# Patient Record
Sex: Male | Born: 1960 | Race: White | Hispanic: No | Marital: Married | Smoking: Current every day smoker
Health system: Southern US, Community
[De-identification: ages and names within clinical notes are randomized; demographics above are authoritative.]

## PROBLEM LIST (undated history)

## (undated) DIAGNOSIS — C801 Malignant (primary) neoplasm, unspecified: Secondary | ICD-10-CM

## (undated) DIAGNOSIS — H539 Unspecified visual disturbance: Secondary | ICD-10-CM

## (undated) DIAGNOSIS — R519 Headache, unspecified: Secondary | ICD-10-CM

## (undated) DIAGNOSIS — R51 Headache: Secondary | ICD-10-CM

## (undated) DIAGNOSIS — I471 Supraventricular tachycardia: Secondary | ICD-10-CM

## (undated) DIAGNOSIS — I4719 Other supraventricular tachycardia: Secondary | ICD-10-CM

## (undated) DIAGNOSIS — G35 Multiple sclerosis: Secondary | ICD-10-CM

## (undated) DIAGNOSIS — C44310 Basal cell carcinoma of skin of unspecified parts of face: Secondary | ICD-10-CM

## (undated) HISTORY — DX: Supraventricular tachycardia: I47.1

## (undated) HISTORY — DX: Multiple sclerosis: G35

## (undated) HISTORY — DX: Unspecified visual disturbance: H53.9

## (undated) HISTORY — DX: Other supraventricular tachycardia: I47.19

## (undated) HISTORY — DX: Malignant (primary) neoplasm, unspecified: C80.1

## (undated) HISTORY — PX: CARDIAC ELECTROPHYSIOLOGY MAPPING AND ABLATION: SHX1292

## (undated) HISTORY — PX: TONSILLECTOMY: SUR1361

## (undated) HISTORY — DX: Basal cell carcinoma of skin of unspecified parts of face: C44.310

## (undated) HISTORY — DX: Headache: R51

## (undated) HISTORY — DX: Headache, unspecified: R51.9

---

## 2001-10-27 ENCOUNTER — Encounter: Admission: RE | Admit: 2001-10-27 | Discharge: 2001-10-27 | Payer: Self-pay | Admitting: Family Medicine

## 2001-10-27 ENCOUNTER — Encounter: Payer: Self-pay | Admitting: Family Medicine

## 2001-10-31 ENCOUNTER — Encounter: Payer: Self-pay | Admitting: Family Medicine

## 2001-10-31 ENCOUNTER — Ambulatory Visit (HOSPITAL_COMMUNITY): Admission: RE | Admit: 2001-10-31 | Discharge: 2001-10-31 | Payer: Self-pay | Admitting: Family Medicine

## 2002-12-25 ENCOUNTER — Ambulatory Visit (HOSPITAL_COMMUNITY): Admission: RE | Admit: 2002-12-25 | Discharge: 2002-12-25 | Payer: Self-pay | Admitting: Internal Medicine

## 2014-07-05 HISTORY — PX: SKIN CANCER EXCISION: SHX779

## 2015-08-26 ENCOUNTER — Ambulatory Visit (INDEPENDENT_AMBULATORY_CARE_PROVIDER_SITE_OTHER): Payer: Medicare Other | Admitting: Neurology

## 2015-08-26 ENCOUNTER — Other Ambulatory Visit: Payer: Self-pay | Admitting: *Deleted

## 2015-08-26 ENCOUNTER — Encounter: Payer: Self-pay | Admitting: Neurology

## 2015-08-26 VITALS — BP 148/84 | HR 66 | Resp 14 | Ht 73.0 in | Wt 201.2 lb

## 2015-08-26 DIAGNOSIS — F32A Depression, unspecified: Secondary | ICD-10-CM

## 2015-08-26 DIAGNOSIS — R269 Unspecified abnormalities of gait and mobility: Secondary | ICD-10-CM | POA: Diagnosis not present

## 2015-08-26 DIAGNOSIS — G894 Chronic pain syndrome: Secondary | ICD-10-CM

## 2015-08-26 DIAGNOSIS — R1013 Epigastric pain: Secondary | ICD-10-CM

## 2015-08-26 DIAGNOSIS — G8929 Other chronic pain: Secondary | ICD-10-CM | POA: Diagnosis not present

## 2015-08-26 DIAGNOSIS — F09 Unspecified mental disorder due to known physiological condition: Secondary | ICD-10-CM

## 2015-08-26 DIAGNOSIS — G35 Multiple sclerosis: Secondary | ICD-10-CM

## 2015-08-26 DIAGNOSIS — F329 Major depressive disorder, single episode, unspecified: Secondary | ICD-10-CM | POA: Insufficient documentation

## 2015-08-26 DIAGNOSIS — Z79899 Other long term (current) drug therapy: Secondary | ICD-10-CM | POA: Diagnosis not present

## 2015-08-26 DIAGNOSIS — G35D Multiple sclerosis, unspecified: Secondary | ICD-10-CM

## 2015-08-26 MED ORDER — OXYBUTYNIN CHLORIDE 5 MG PO TABS: 5.0000 mg | ORAL_TABLET | Freq: Three times a day (TID) | ORAL | Status: DC

## 2015-08-26 MED ORDER — ETODOLAC 400 MG PO TABS: 400.0000 mg | ORAL_TABLET | Freq: Two times a day (BID) | ORAL | Status: DC

## 2015-08-26 MED ORDER — BACLOFEN 10 MG PO TABS: 10.0000 mg | ORAL_TABLET | Freq: Four times a day (QID) | ORAL | Status: DC

## 2015-08-26 MED ORDER — DOXEPIN HCL 10 MG PO CAPS: 10.0000 mg | ORAL_CAPSULE | Freq: Every day | ORAL | Status: DC

## 2015-08-26 NOTE — Progress Notes (Signed)
[facility]  PATIENT: Don Hamilton DOB: 1961/01/03  REFERRING DOCTOR OR PCP:  None SOURCE: patient, records from [facility].  _________________________________   HISTORICAL  CHIEF COMPLAINT:  Chief Complaint  Patient presents with  . Multiple Sclerosis    Former pt. of Dr. Bonnita Hollow from [facility], here for eval of MS.  Sts. he was dx. approx. 1998.  Presenting sx. was left optic neuritis. Sts. dx. confirmed with MRI.  No LP.  He saw Dr. Epimenio Foot and was started on Betaseron which he stopped after 3-4 mos. due to allergic rxn/inj. site rxn.  He then tried Avonex but stopped after 2-3 mos. due to rash.  He then tried Tysabri but stopped due to allergic rxn--rash.  He then tried Campath but stopped after 1-2 infusions due to rash.  Sts. next he trie  . Gait Disturbance    either Aubagio or Tecfidera but stopped after 3 weeks due to diarrhea.  He is not currenlty on any MS med.  He thinks cognition, fatigue, insomnia are worse.  Sts. he has been out of Oxycodone and Baclofen for a couple of weeks so right leg pain, right hip pain is worse/fim    HISTORY Don Hamilton is a 55 year old man with multiple sclerosis who I have previously seen at [facility].  MS History:   He presented in 1998 with left optic neuritis. An MRI of the brain was consistent with multiple sclerosis. He was started on Betaseron but he stopped due to allergic reactions and injection site reactions. Avonex was tried but since he stopped after a while due to rash. When Tysabri was reintroduced around 2006, he went on that for about a year or so. However, he would get a rash with each infusion. Therefore, it was discontinued and he was placed on off label Campath. He received his first year dose but had severe infusion reactions with rash that lasted over a month. Therefore, sucking year Campath was not performed. In 2015, he was placed on Aubagio. After month it was stopped due to  diarrhea he has not been on Tecfidera. Gilenya was considered but he does have a cardiac arrhythmia. Last year, Dr. Alton Revere placed him on Copaxone but he decided not to take it as it is generally weaker than other medications he has been on.        Gait/strength/sensation/Pain: Even before his diagnosis in 1998, he was having some difficulty with his gait. About 10 years ago he started to use a cane.  He continues to use a cane.   Notes transient numbness in the arms and legs.   The numbness and tingling is fairly random many does not identify a trigger.   He ran out of baclofen and felt that it did help his spasticity.      Pain:  He notes right greater than left leg weakness and spasticity. He has a lot of pain in the right leg. Especially around the hip area. This has been evaluated and an orthopedic reason has not been identified.    He also notes neck and shoulder pain.    He currently is taking ibuprofen 800 mg a few times a day.   Bladder:   He feels that bladder function is doing fairly well. There is just minimal frequency and he has nocturia once most nights.  Vision: He notes mildly reduced visual acuity out of both eyes. He has altered color vision out of the left eye.  Fatigue/sleep:   He reports  some cognitive and physical fatigue. She had mild benefit from Adderall but he did note that it curbed his appetite and he has not taken that recently. Provigil was tried in the past but was not helpful. He reports difficulty falling and staying asleep.   He feels unrefreshed in the morning.  Mood/cognition: He feels he has a mild depression. In the past she was on anti-depressant but felt he was able to stop and is uncertain if he needs anything now. He has noted difficulty with cognition. Specifically he has reduced attention, short-term memory and executive function. He was working as an Art gallery manager in the past.  He had a UDS that was positive for cocaine last year.  He denies any use of it.  We  discussed that for the time being I can't write any controlled substances for him.     REVIEW OF SYSTEMS: Constitutional: No fevers, chills, sweats, or change in appetite.   He has fatigue and insomnia Eyes: No visual changes, double vision, eye pain Ear, nose and throat: No hearing loss, ear pain, nasal congestion, sore throat Cardiovascular: No chest pain, palpitations Respiratory: No shortness of breath at rest or with exertion.   No wheezes GastrointestinaI: No nausea, vomiting, diarrhea, abdominal pain, fecal incontinence Genitourinary: No dysuria, urinary retention or frequency.  No nocturia. Musculoskeletal: No neck pain.  Notes right hip > back pain Integumentary: No rash, pruritus, skin lesions Neurological: as above Psychiatric: Notes mild depression at this time.  No anxiety Endocrine: No palpitations, diaphoresis, change in appetite, change in weigh or increased thirst Hematologic/Lymphatic: No anemia, purpura, petechiae. Allergic/Immunologic: No itchy/runny eyes, nasal congestion, recent allergic reactions, rashes  ALLERGIES: Allergies  Allergen Reactions  . Campath [Alemtuzumab] Hives  . Avonex [Interferon Beta-1a] Rash  . Betaseron [Interferon Beta-1b] Rash  . Tysabri [Natalizumab] Rash    HOME MEDICATIONS:  Current outpatient prescriptions:  .  baclofen (LIORESAL) 10 MG tablet, Take 10 mg by mouth 4 (four) times daily., Disp: , Rfl:  .  hydrOXYzine (ATARAX/VISTARIL) 10 MG tablet, Take 10 mg by mouth 3 (three) times daily as needed., Disp: , Rfl:  .  ibuprofen (ADVIL,MOTRIN) 200 MG tablet, Take 200 mg by mouth every 6 (six) hours as needed., Disp: , Rfl:  .  meclizine (ANTIVERT) 25 MG tablet, Take 25 mg by mouth 2 (two) times daily as needed for dizziness., Disp: , Rfl:  .  omeprazole (PRILOSEC) 20 MG capsule, Take 20 mg by mouth daily., Disp: , Rfl:  .  Oxycodone HCl 10 MG TABS, Take 10 mg by mouth 4 (four) times daily as needed., Disp: , Rfl:   PAST MEDICAL  HISTORY: Past Medical History  Diagnosis Date  . Multiple sclerosis (HCC)   . Cancer (HCC)   . Basal cell carcinoma of face   . Headache   . Vision abnormalities   . Atrial tachycardia (HCC)     PAST SURGICAL HISTORY: Past Surgical History  Procedure Laterality Date  . Skin cancer excision  2016    nose  . Tonsillectomy    . Cardiac electrophysiology mapping and ablation      FAMILY HISTORY: Family History  Problem Relation Age of Onset  . Healthy Mother   . Stroke Father   . Heart attack Father   . Leukemia Father     SOCIAL HISTORY:  Social History   Social History  . Marital Status: Married    Spouse Name: N/A  . Number of Children: N/A  . Years of Education:  N/A   Occupational History  . Not on file.   Social History Main Topics  . Smoking status: Not on file  . Smokeless tobacco: Not on file  . Alcohol Use: Not on file  . Drug Use: Not on file  . Sexual Activity: Not on file   Other Topics Concern  . Not on file   Social History Narrative  . No narrative on file     PHYSICAL EXAM  Filed Vitals:   08/26/15 1021  BP: 148/84  Pulse: 66  Resp: 14  Height: 6\' 1"  (1.854 m)  Weight: 201 lb 3.2 oz (91.264 kg)    Body mass index is 26.55 kg/(m^2).   General: The patient is well-developed and well-nourished and in no acute distress  Eyes:  Funduscopic exam shows normal optic discs and retinal vessels.  Neck: The neck is supple, no carotid bruits are noted.  The neck is nontender.  Cardiovascular: The heart has a regular rate and rhythm with a normal S1 and S2. There were no murmurs, gallops or rubs. Lungs are clear to auscultation.  Skin: Extremities are without significant edema.  Musculoskeletal:  Back is nontender  Neurologic Exam  Mental status: The patient is alert and oriented x 3 at the time of the examination. The patient has apparent normal recent and remote memory, with an apparently normal attention span and concentration  ability.   Speech is normal.  Cranial nerves: Extraocular movements are full. Pupils are equal, round, and reactive to light and accomodation.  Visual fields are full.  Color vision is slightly reduced on left.   VA is symmetric.  Facial symmetry is present. There is good facial sensation to soft touch bilaterally.Facial strength is normal.  Trapezius and sternocleidomastoid strength is normal. No dysarthria is noted.  The tongue is midline, and the patient has symmetric elevation of the soft palate. No obvious hearing deficits are noted.  Motor:  Muscle bulk is normal.   Tone is normal. Strength is  5 / 5 in all 4 extremities.   Sensory: Sensory testing is intact to  touch and vibration sensation in all 4 extremities.  Coordination: Cerebellar testing reveals good finger-nose-finger and reduced heel-to-shin bilaterally.  Gait and station: Station is normal.   Gait is mildly wide. Tandem gait is wide. Romberg is negative.   Reflexes: Deep tendon reflexes are symmetric and increased in legs bilaterally.   Plantar responses are flexor.    DIAGNOSTIC DATA (LABS, IMAGING, TESTING) - I reviewed patient records, labs, notes, testing and imaging myself where available.      ASSESSMENT AND PLAN  Multiple sclerosis (HCC) - Plan: CBC with Differential/Platelet, Hepatic function panel, Hepatitis c antibody (reflex), Hepatitis B surface ag-pre vc imm st, Hepatitis B surface antibody, Quantiferon tb gold assay (blood), Hep B Surface Antigen, Compliance Drug Analysis, Ur  High risk medication use - Plan: CBC with Differential/Platelet, Hepatic function panel, Hepatitis c antibody (reflex), Hepatitis B surface ag-pre vc imm st, Hepatitis B surface antibody, Quantiferon tb gold assay (blood), Hep B Surface Antigen, Compliance Drug Analysis, Ur, CANCELED: [id number] 11+Oxyco+Alc+Crt-Bund  Gait disturbance  Abdominal pain, chronic, epigastric  Cognitive disorder  Depression  Chronic pain syndrome -  Plan: CANCELED: [id number] 11+Oxyco+Alc+Crt-Bund   1.   We had a long discussion about disease modifying therapies. He has had difficulty tolerating quite a few different medications. We went over some of the options including Zinbryta and waiting for ocrelizumab. We discussed the pros and cons of these options. He  would like to go ahead and start Zinbryta and I had him sign the service request form. We will check blood work for CBC, LFT, hep C, hep B and TB. He is advised to call us immediately if he notes any significant skin changes 2.   I will get him back on the baclofen and start doxepin at night to help with his sleep. Additionally I will switch his OTC ibuprofen to Lodine 400 mg by mouth twice a day to simplify the medications throughout the day. 3.   Oxybutynin when necessary for the bladder. 4.   At this point, I would not be writing any controlled substances for him.  5.   He will return to see me in 4 months or sooner if there are new or worsening neurologic symptoms.  45 minute face-to-face evaluation with greater than one half of the time counseling coordinating care about multiple sclerosis, symptoms and treatments.   Malaak Stach A. Epimenio Foot, MD, PhD 08/26/2015, 10:23 AM Certified in Neurology, Clinical Neurophysiology, Sleep Medicine, Pain Medicine and Neuroimaging  [facility] [address], [address] [place], [place] [address] [phone]

## 2015-08-27 ENCOUNTER — Telehealth: Payer: Self-pay | Admitting: *Deleted

## 2015-08-27 LAB — CBC WITH DIFFERENTIAL/PLATELET
Basophils Absolute: 0 10*3/uL (ref 0.0–0.2)
Basos: 0 %
EOS (ABSOLUTE): 0.1 10*3/uL (ref 0.0–0.4)
Eos: 1 %
Hematocrit: 42.9 % (ref 37.5–51.0)
Hemoglobin: 14.6 g/dL (ref 12.6–17.7)
Immature Grans (Abs): 0 10*3/uL (ref 0.0–0.1)
Immature Granulocytes: 0 %
Lymphocytes Absolute: 0.9 10*3/uL (ref 0.7–3.1)
Lymphs: 10 %
MCH: 33.1 pg — ABNORMAL HIGH (ref 26.6–33.0)
MCHC: 34 g/dL (ref 31.5–35.7)
MCV: 97 fL (ref 79–97)
Monocytes Absolute: 0.4 10*3/uL (ref 0.1–0.9)
Monocytes: 4 %
Neutrophils Absolute: 8 10*3/uL — ABNORMAL HIGH (ref 1.4–7.0)
Neutrophils: 85 %
Platelets: 174 10*3/uL (ref 150–379)
RBC: 4.41 x10E6/uL (ref 4.14–5.80)
RDW: 13.3 % (ref 12.3–15.4)
WBC: 9.5 10*3/uL (ref 3.4–10.8)

## 2015-08-27 LAB — HEPATIC FUNCTION PANEL
ALT: 12 IU/L (ref 0–44)
AST: 18 IU/L (ref 0–40)
Albumin: 4.1 g/dL (ref 3.5–5.5)
Alkaline Phosphatase: 70 IU/L (ref 39–117)
Bilirubin Total: 0.8 mg/dL (ref 0.0–1.2)
Bilirubin, Direct: 0.22 mg/dL (ref 0.00–0.40)
Total Protein: 6.3 g/dL (ref 6.0–8.5)

## 2015-08-27 LAB — HCV COMMENT:

## 2015-08-27 LAB — HEPATITIS B SURFACE ANTIBODY, QUANTITATIVE: Hepatitis B Surf Ab Quant: <3.1 m[IU]/mL — ABNORMAL LOW (ref >9.9)

## 2015-08-27 LAB — HEPATITIS C ANTIBODY (REFLEX): HCV Ab: <0.1 s/co ratio (ref 0.0–0.9)

## 2015-08-27 NOTE — Telephone Encounter (Signed)
Disability form for [facility] completed and faxed back to [facility] fax # [phone].  Original mailed to pt. at his home address.  Copy sent to be scanned into EPIC/fim

## 2015-08-29 LAB — QUANTIFERON IN TUBE
QFT TB AG MINUS NIL VALUE: 0.01 [IU]/mL
QUANTIFERON MITOGEN VALUE: 4.78 [IU]/mL
QUANTIFERON TB AG VALUE: 0.04 [IU]/mL
QUANTIFERON TB GOLD: NEGATIVE
Quantiferon Nil Value: 0.03 IU/mL

## 2015-08-29 LAB — QUANTIFERON TB GOLD ASSAY (BLOOD)

## 2015-08-30 LAB — HEPATITIS B SURFACE ANTIGEN: HEP B S AG: NEGATIVE

## 2015-08-30 LAB — COMPLIANCE DRUG ANALYSIS, UR: PDF: 0

## 2015-09-10 ENCOUNTER — Encounter: Payer: Self-pay | Admitting: Neurology

## 2016-08-04 ENCOUNTER — Encounter: Payer: Self-pay | Admitting: Neurology

## 2016-08-04 ENCOUNTER — Ambulatory Visit (INDEPENDENT_AMBULATORY_CARE_PROVIDER_SITE_OTHER): Payer: Medicare Other | Admitting: Neurology

## 2016-08-04 VITALS — BP 151/101 | HR 67 | Resp 16 | Ht 73.0 in | Wt 205.0 lb

## 2016-08-04 DIAGNOSIS — Z79899 Other long term (current) drug therapy: Secondary | ICD-10-CM | POA: Diagnosis not present

## 2016-08-04 DIAGNOSIS — G4700 Insomnia, unspecified: Secondary | ICD-10-CM | POA: Insufficient documentation

## 2016-08-04 DIAGNOSIS — R269 Unspecified abnormalities of gait and mobility: Secondary | ICD-10-CM

## 2016-08-04 DIAGNOSIS — F09 Unspecified mental disorder due to known physiological condition: Secondary | ICD-10-CM

## 2016-08-04 DIAGNOSIS — R5383 Other fatigue: Secondary | ICD-10-CM | POA: Insufficient documentation

## 2016-08-04 DIAGNOSIS — F329 Major depressive disorder, single episode, unspecified: Secondary | ICD-10-CM

## 2016-08-04 DIAGNOSIS — F32A Depression, unspecified: Secondary | ICD-10-CM

## 2016-08-04 DIAGNOSIS — G35 Multiple sclerosis: Secondary | ICD-10-CM

## 2016-08-04 MED ORDER — GABAPENTIN 300 MG PO CAPS: 300.0000 mg | ORAL_CAPSULE | Freq: Three times a day (TID) | ORAL | 11 refills | Status: DC

## 2016-08-04 MED ORDER — HYDROXYZINE HCL 10 MG PO TABS: 10.0000 mg | ORAL_TABLET | Freq: Three times a day (TID) | ORAL | 5 refills | Status: DC | PRN

## 2016-08-04 MED ORDER — CYCLOBENZAPRINE HCL 5 MG PO TABS: ORAL_TABLET | ORAL | 11 refills | Status: DC

## 2016-08-04 MED ORDER — BACLOFEN 10 MG PO TABS: 10.0000 mg | ORAL_TABLET | Freq: Four times a day (QID) | ORAL | 11 refills | Status: DC

## 2016-08-04 MED ORDER — OXYBUTYNIN CHLORIDE 5 MG PO TABS: 5.0000 mg | ORAL_TABLET | Freq: Three times a day (TID) | ORAL | 11 refills | Status: DC

## 2016-08-04 NOTE — Progress Notes (Addendum)
[facility]  PATIENT: Don Hamilton DOB: 06/06/1961  REFERRING DOCTOR OR PCP:  None SOURCE: patient, records from [facility].  _________________________________   HISTORICAL  CHIEF COMPLAINT:  Chief Complaint  Patient presents with  . Multiple Sclerosis    He continues off of any MS med.  Sts. he is having more difficulty with memory/cognition.  Also c/o more difficulty with going to and staying asleep. He has not been taking Doxepin--sts. he wasn't aware this was a sleep med, so he stopped it./fim    HISTORY Don Hamilton is a 56 year old man with multiple sclerosis.  MS:   He remains off any DMT.   He was on Alemtuzumab about 2011 but had side effects and never had the second year.   Gait/strength/sensation/Pain: He has had difficulty with his gait since 2000. About 10 years ago he started to use a cane.  He continues to use a cane.   Notes transient numbness in the arms and legs.   The numbness and tingling is fairly random many does not identify a trigger.   He ran out of baclofen and felt that it did help his spasticity.      Pain:  He notes pain in the right greater than left leg associated with spasticity. Hip area is most painful.   This has been evaluated and an orthopedic reason has not been identified.    He also notes neck and shoulder pain.    He currently is taking ibuprofen 800 mg a few times a day.   He takes Aleve.   He had a UDS that was positive for cocaine in 2016.  He denies any use of it.  We discussed that for the time being I can't write any controlled substances for him.     Bladder:   His bladder function is doing weld he tolerates it welll on oxybutynin . There is just minimal frequency and he has nocturia x 1.  Vision: He notes mildly reduced visual acuity out of both eyes. He has altered color vision out of the left eye.  Fatigue/sleep:   He reports some cognitive and physical fatigue. He had mild benefit for the fatigue with the  Adderall but it was not well tolerated. Nuvigil was tried in the past but was not helpful. He reports difficulty falling and staying asleep.  He did not take the doxepin.   He feels unrefreshed in the morning.   A PSG did not show OSA.   Mood/cognition: He notes mild depression and some anxiety.  Anti depressants had not helped much so he stopped.   He was working as an Art gallery manager in the past.   Cognitive issues are his main concernHe has difficulty with cognition, mostly problems with reduced attention, short-term memory and executive function.    MS History:   He presented in 1998 with left optic neuritis. An MRI of the brain was consistent with multiple sclerosis. He was started on Betaseron but he stopped due to allergic reactions and injection site reactions. Avonex was tried but since he stopped after a while due to rash. When Tysabri was reintroduced around 2006, he went on that for about a year or so. However, he would get a rash with each infusion. Therefore, it was discontinued and he was placed on off label Campath. He received his first year dose but had severe infusion reactions with rash that lasted over a month. Therefore, sucking year Campath was not performed. In 2015, he was placed on  Aubagio. After month it was stopped due to diarrhea he has not been on Tecfidera. Gilenya was considered but he does have a cardiac arrhythmia. Last year, Dr. Alton Revere placed him on Copaxone but he decided not to take it as it is generally weaker than other medications he has been on.         REVIEW OF SYSTEMS: Constitutional: No fevers, chills, sweats, or change in appetite.   He has fatigue and insomnia Eyes: No visual changes, double vision, eye pain Ear, nose and throat: No hearing loss, ear pain, nasal congestion, sore throat Cardiovascular: No chest pain, palpitations Respiratory: No shortness of breath at rest or with exertion.   No wheezes GastrointestinaI: No nausea, vomiting, diarrhea, abdominal  pain, fecal incontinence Genitourinary: No dysuria, urinary retention or frequency.  No nocturia. Musculoskeletal: No neck pain.  Notes right hip > back pain Integumentary: No rash, pruritus, skin lesions Neurological: as above Psychiatric: Notes mild depression at this time.  No anxiety Endocrine: No palpitations, diaphoresis, change in appetite, change in weigh or increased thirst Hematologic/Lymphatic: No anemia, purpura, petechiae. Allergic/Immunologic: No itchy/runny eyes, nasal congestion, recent allergic reactions, rashes  ALLERGIES: Allergies  Allergen Reactions  . Campath [Alemtuzumab] Hives  . Avonex [Interferon Beta-1a] Rash  . Betaseron [Interferon Beta-1b] Rash  . Tysabri [Natalizumab] Rash    HOME MEDICATIONS:  Current Outpatient Prescriptions:  .  baclofen (LIORESAL) 10 MG tablet, Take 1 tablet (10 mg total) by mouth 4 (four) times daily., Disp: 120 each, Rfl: 11 .  etodolac (LODINE) 400 MG tablet, Take 1 tablet (400 mg total) by mouth 2 (two) times daily., Disp: 60 tablet, Rfl: 5 .  hydrOXYzine (ATARAX/VISTARIL) 10 MG tablet, Take 1 tablet (10 mg total) by mouth 3 (three) times daily as needed., Disp: 90 tablet, Rfl: 5 .  ibuprofen (ADVIL,MOTRIN) 200 MG tablet, Take 200 mg by mouth every 6 (six) hours as needed., Disp: , Rfl:  .  meclizine (ANTIVERT) 25 MG tablet, Take 25 mg by mouth 2 (two) times daily as needed for dizziness., Disp: , Rfl:  .  oxybutynin (DITROPAN) 5 MG tablet, Take 1 tablet (5 mg total) by mouth 3 (three) times daily., Disp: 60 tablet, Rfl: 11 .  cyclobenzaprine (FLEXERIL) 5 MG tablet, One to two at bedtime, Disp: 60 tablet, Rfl: 11 .  doxepin (SINEQUAN) 10 MG capsule, Take 1 capsule (10 mg total) by mouth at bedtime. (Patient not taking: Reported on 08/04/2016), Disp: 30 capsule, Rfl: 5 .  gabapentin (NEURONTIN) 300 MG capsule, Take 1 capsule (300 mg total) by mouth 3 (three) times daily., Disp: 90 capsule, Rfl: 11 .  omeprazole (PRILOSEC) 20 MG  capsule, Take 20 mg by mouth daily., Disp: , Rfl:  .  Oxycodone HCl 10 MG TABS, Take 10 mg by mouth 4 (four) times daily as needed., Disp: , Rfl:   PAST MEDICAL HISTORY: Past Medical History:  Diagnosis Date  . Atrial tachycardia (HCC)   . Basal cell carcinoma of face   . Cancer (HCC)   . Headache   . Multiple sclerosis (HCC)   . Vision abnormalities     PAST SURGICAL HISTORY: Past Surgical History:  Procedure Laterality Date  . CARDIAC ELECTROPHYSIOLOGY MAPPING AND ABLATION    . SKIN CANCER EXCISION  2016   nose  . TONSILLECTOMY      FAMILY HISTORY: Family History  Problem Relation Age of Onset  . Healthy Mother   . Stroke Father   . Heart attack Father   .  Leukemia Father     SOCIAL HISTORY:  Social History   Social History  . Marital status: Married    Spouse name: N/A  . Number of children: N/A  . Years of education: N/A   Occupational History  . Not on file.   Social History Main Topics  . Smoking status: Current Every Day Smoker  . Smokeless tobacco: Never Used  . Alcohol use 0.0 oz/week     Comment: occasional beer  . Drug use: No  . Sexual activity: Not on file   Other Topics Concern  . Not on file   Social History Narrative  . No narrative on file     PHYSICAL EXAM  Vitals:   08/04/16 0959  BP: (!) 151/101  Pulse: 67  Resp: 16  Weight: 205 lb (93 kg)  Height: 6\' 1"  (1.854 m)    Body mass index is 27.05 kg/m.   General: The patient is well-developed and well-nourished and in no acute distress   Neurologic Exam  Mental status: The patient is alert and oriented x 3 at the time of the examination. The patient has apparent normal recent and remote memory, with an apparently normal attention span and concentration ability.   Speech is normal.  Cranial nerves: Extraocular movements are full. Pupils are equal, round, and reactive to light and accomodation.  Visual fields are full.  Color vision is slightly reduced on left.      Facial  symmetry is present. There is good facial sensation to soft touch bilaterally.Facial strength is normal.  Trapezius and sternocleidomastoid strength is normal. No dysarthria is noted.  The tongue is midline, and the patient has symmetric elevation of the soft palate. No obvious hearing deficits are noted.  Motor:  Muscle bulk is normal.   Tone is normal. Strength is  5 / 5 in all 4 extremities.   Sensory: Sensory testing is intact to  touch and vibration sensation in all 4 extremities.  Coordination: Cerebellar testing reveals good finger-nose-finger but he has reducedheel-to-shin bilaterally.  Gait and station: Station is normal.   His gait is mildly wide and the tandem gait is wide. Romberg is negative.   Reflexes: Deep tendon reflexes are symmetric and increased in legs bilaterally. There was spread at the knees.  Plantar responses are flexor.    DIAGNOSTIC DATA (LABS, IMAGING, TESTING) - I reviewed patient records, labs, notes, testing and imaging myself where available.      ASSESSMENT AND PLAN  Multiple sclerosis (HCC) - Plan: CBC with Differential/Platelet, Hepatic function panel  Gait disturbance  High risk medication use  Depression, unspecified depression type  Insomnia, unspecified type  Other fatigue   1.   He never went on Zinbryta and would prefer not to due to the liver toxicity issues. We did discuss other options and he is most interested in [place]. He signed the service request form. 2.   Baclofen was renewed. He will continue with the Flexeril at bedtime and gabapentin and etodolac to help with the pain. I renewed hydroxyzine for his itching to take as needed.  3.   He will continue oxybutynin for his bladder issues.  4.   Due to the combination of his MS related physical and cognitive impairments, Mr. Iwanicki is unable to work.  5.   Return to see me in 6 months or sooner if there are new or worsening neurologic symptoms.   Tanayia Wahlquist A. Epimenio Foot, MD, PhD  08/04/2016, 10:39 AM Certified in Neurology, Clinical Neurophysiology,  Sleep Medicine, Pain Medicine and Neuroimaging  [facility] [address] [place], [place] [address] [phone]

## 2016-08-05 LAB — HEPATIC FUNCTION PANEL
ALT: 21 IU/L (ref 0–44)
AST: 21 IU/L (ref 0–40)
Albumin: 4.7 g/dL (ref 3.5–5.5)
Alkaline Phosphatase: 59 IU/L (ref 39–117)
BILIRUBIN TOTAL: 0.9 mg/dL (ref 0.0–1.2)
BILIRUBIN, DIRECT: 0.2 mg/dL (ref 0.00–0.40)
TOTAL PROTEIN: 6.9 g/dL (ref 6.0–8.5)

## 2016-08-05 LAB — CBC WITH DIFFERENTIAL/PLATELET
BASOS ABS: 0 10*3/uL (ref 0.0–0.2)
BASOS: 0 %
EOS (ABSOLUTE): 0.1 10*3/uL (ref 0.0–0.4)
Eos: 1 %
Hematocrit: 40.9 % (ref 37.5–51.0)
Hemoglobin: 14 g/dL (ref 13.0–17.7)
IMMATURE GRANS (ABS): 0 10*3/uL (ref 0.0–0.1)
Immature Granulocytes: 0 %
LYMPHS: 14 %
Lymphocytes Absolute: 1 10*3/uL (ref 0.7–3.1)
MCH: 32.4 pg (ref 26.6–33.0)
MCHC: 34.2 g/dL (ref 31.5–35.7)
MCV: 95 fL (ref 79–97)
Monocytes Absolute: 0.4 10*3/uL (ref 0.1–0.9)
Monocytes: 6 %
Neutrophils Absolute: 5.4 10*3/uL (ref 1.4–7.0)
Neutrophils: 79 %
PLATELETS: 159 10*3/uL (ref 150–379)
RBC: 4.32 x10E6/uL (ref 4.14–5.80)
RDW: 13.3 % (ref 12.3–15.4)
WBC: 6.8 10*3/uL (ref 3.4–10.8)

## 2016-08-10 ENCOUNTER — Encounter: Payer: Self-pay | Admitting: *Deleted

## 2016-08-11 ENCOUNTER — Telehealth: Payer: Self-pay | Admitting: Neurology

## 2016-08-11 NOTE — Telephone Encounter (Signed)
I have spoken with Don Hamilton--rx/srf will be sent to [facility] by [employer]/fim

## 2016-08-11 NOTE — Telephone Encounter (Signed)
Leann with [facility] is calling to get a new Rx for Tecfidera for the patient.

## 2016-08-16 ENCOUNTER — Encounter: Payer: Self-pay | Admitting: *Deleted

## 2016-08-19 ENCOUNTER — Ambulatory Visit
Admission: RE | Admit: 2016-08-19 | Discharge: 2016-08-19 | Disposition: A | Discharge status: Home / Self Care | Payer: Medicare Other | Source: Ambulatory Visit | Attending: Neurology | Admitting: Neurology

## 2016-08-19 DIAGNOSIS — R269 Unspecified abnormalities of gait and mobility: Secondary | ICD-10-CM | POA: Diagnosis not present

## 2016-08-19 DIAGNOSIS — G35 Multiple sclerosis: Secondary | ICD-10-CM

## 2016-08-19 MED ORDER — GADOBENATE DIMEGLUMINE 529 MG/ML IV SOLN
20.0000 mL | Freq: Once | INTRAVENOUS | Status: AC | PRN
  Administered 2016-08-19: 20 mL via INTRAVENOUS

## 2016-08-20 ENCOUNTER — Telehealth: Payer: Self-pay | Admitting: *Deleted

## 2016-08-20 NOTE — Telephone Encounter (Signed)
-----   Message from Britt Bottom, MD sent at 08/19/2016  6:57 PM EST ----- Please let him know that the MRI of the brain does not show any new lesions.  (it was compared to [place])

## 2016-08-20 NOTE — Telephone Encounter (Signed)
I have spoken with wife Murray Hodgkins, and per RAS, advised MRI shows no new MS lesions.  She verbalized understanding of same/fim

## 2017-08-19 ENCOUNTER — Other Ambulatory Visit: Payer: Self-pay | Admitting: Neurology

## 2017-09-01 ENCOUNTER — Other Ambulatory Visit: Payer: Self-pay

## 2017-09-01 ENCOUNTER — Ambulatory Visit: Payer: Medicare Other | Admitting: Neurology

## 2017-09-01 ENCOUNTER — Encounter (INDEPENDENT_AMBULATORY_CARE_PROVIDER_SITE_OTHER): Payer: Self-pay

## 2017-09-01 ENCOUNTER — Encounter: Payer: Self-pay | Admitting: Neurology

## 2017-09-01 VITALS — BP 134/83 | HR 73 | Ht 73.0 in | Wt 208.0 lb

## 2017-09-01 DIAGNOSIS — R269 Unspecified abnormalities of gait and mobility: Secondary | ICD-10-CM

## 2017-09-01 DIAGNOSIS — F418 Other specified anxiety disorders: Secondary | ICD-10-CM

## 2017-09-01 DIAGNOSIS — Z79899 Other long term (current) drug therapy: Secondary | ICD-10-CM

## 2017-09-01 DIAGNOSIS — F09 Unspecified mental disorder due to known physiological condition: Secondary | ICD-10-CM | POA: Diagnosis not present

## 2017-09-01 DIAGNOSIS — G35 Multiple sclerosis: Secondary | ICD-10-CM | POA: Diagnosis not present

## 2017-09-01 MED ORDER — BACLOFEN 10 MG PO TABS: 10.0000 mg | ORAL_TABLET | Freq: Four times a day (QID) | ORAL | 11 refills | Status: DC

## 2017-09-01 MED ORDER — GABAPENTIN 300 MG PO CAPS: 300.0000 mg | ORAL_CAPSULE | Freq: Three times a day (TID) | ORAL | 11 refills | Status: DC

## 2017-09-01 MED ORDER — CYCLOBENZAPRINE HCL 5 MG PO TABS: ORAL_TABLET | ORAL | 11 refills | Status: DC

## 2017-09-01 MED ORDER — ESCITALOPRAM OXALATE 20 MG PO TABS: 20.0000 mg | ORAL_TABLET | Freq: Every day | ORAL | 11 refills | Status: DC

## 2017-09-01 MED ORDER — HYDROXYZINE HCL 10 MG PO TABS: 10.0000 mg | ORAL_TABLET | Freq: Three times a day (TID) | ORAL | 5 refills | Status: DC | PRN

## 2017-09-01 MED ORDER — OXYBUTYNIN CHLORIDE 5 MG PO TABS: 5.0000 mg | ORAL_TABLET | Freq: Two times a day (BID) | ORAL | 11 refills | Status: DC

## 2017-09-01 MED ORDER — MONTELUKAST SODIUM 10 MG PO TABS: 10.0000 mg | ORAL_TABLET | Freq: Every day | ORAL | 11 refills | Status: DC

## 2017-09-01 MED ORDER — DOXEPIN HCL 10 MG PO CAPS: 10.0000 mg | ORAL_CAPSULE | Freq: Every day | ORAL | 11 refills | Status: DC

## 2017-09-01 NOTE — Progress Notes (Signed)
[facility]  PATIENT: Don Hamilton DOB: 1961-01-24  REFERRING DOCTOR OR PCP:  None SOURCE: patient, records from [facility].  _________________________________   HISTORICAL  CHIEF COMPLAINT:  Chief Complaint  Patient presents with  . Multiple Sclerosis    Sts. he started [place] but stopped 2-3 mos. ago b/c he was having whelps/hives.  Spoke with the pharmaicst and was advised to stop.  Sts. he tried to call our office to discuss this, but there is no record of a call from him.  Sts. he still has difficulty going to and staying asleep.  Has Doxepin at home but has not tried it.  C/O increased fatigue, anxiety, generalized pain.  Needs r/f of Flexeril, Gabapentin, Hydroxyzine, Oxybutynin, Gabapentin.    HISTORY Don Hamilton is a 57 year old man with multiple sclerosis.  Update 09/01/2017:   He started the Tecfidera but stopped  a couple months ago because he was having side effects including flushing and rash/hives.  The rash would last the whole day.   Of note, he did have one course of treatment with alemtuzumab in 2011 (off label at that time) but did not have the second years infusion due to infusion reactions.     Before alemtuzumab, he was on Tysabri but had severe infusion reactions and needed to stop.   He also had been at various times on Betaseron, Aubagio, Gilenya.     He is having difficulty with his gait and relies on a cane constantly now. Going up and down stairs is difficult. He stumbles and has occasional falls. He has spasticity mostly in the legs. Baclofen gives him some benefit. He continues to have some difficulty with bladder frequency and urgency. This is helped a little bit with the oxybutynin.   Vision is doing about the same (mild reduced on left). The patient is doing about the same as last visit.  He feels his sleep and fatigue are both doing worse. Insomnia has been a chronic issue. He does sleep a little bit better when he takes  gabapentin, Flexeril and baclofen at bedtime.    He tried Nuvigil for fatigue but there was no benefit.     Depression and anxiety are both worsening. Years ago he was on Lexapro with some benefit.  He does have basal cell carcinoma (nose) and had surgery.   From 08/04/2016: MS:   He remains off any DMT.   He was on Alemtuzumab about 2011 but had side effects and never had the second year.    Gait/strength/sensation/Pain: He has had difficulty with his gait since 2000. About 10 years ago he started to use a cane.  He continues to use a cane.   Notes transient numbness in the arms and legs.   The numbness and tingling is fairly random many does not identify a trigger.   He ran out of baclofen and felt that it did help his spasticity.      Pain:  He notes pain in the right greater than left leg associated with spasticity. Hip area is most painful.   This has been evaluated and an orthopedic reason has not been identified.    He also notes neck and shoulder pain.    He currently is taking ibuprofen 800 mg a few times a day.   He takes Aleve.   He had a UDS that was positive for cocaine in 2016.  He denies any use of it.  We discussed that for the time being I can't write  any controlled substances for him.     Bladder:   His bladder function is doing weld he tolerates it welll on oxybutynin . There is just minimal frequency and he has nocturia x 1.  Vision: He notes mildly reduced visual acuity out of both eyes. He has altered color vision out of the left eye.  Fatigue/sleep:   He reports some cognitive and physical fatigue. He had mild benefit for the fatigue with the Adderall but it was not well tolerated. Nuvigil was tried in the past but was not helpful. He reports difficulty falling and staying asleep.  He did not take the doxepin.   He feels unrefreshed in the morning.   A PSG did not show OSA.   Mood/cognition: He notes mild depression and some anxiety.  Anti depressants had not helped much so  he stopped.   He was working as an Art gallery manager in the past.   Cognitive issues are his main concernHe has difficulty with cognition, mostly problems with reduced attention, short-term memory and executive function.    MS History:   He presented in 1998 with left optic neuritis. An MRI of the brain was consistent with multiple sclerosis. He was started on Betaseron but he stopped due to allergic reactions and injection site reactions. Avonex was tried but since he stopped after a while due to rash. When Tysabri was reintroduced around 2006, he went on that for about a year or so. However, he would get a rash with each infusion. Therefore, it was discontinued and he was placed on off label Campath. He received his first year dose but had severe infusion reactions with rash that lasted over a month. Therefore, sucking year Campath was not performed. In 2015, he was placed on Aubagio. After month it was stopped due to diarrhea he has not been on Tecfidera. Gilenya was considered but he does have a cardiac arrhythmia. Last year, Dr. Alton Revere placed him on Copaxone but he decided not to take it as it is generally weaker than other medications he has been on.         REVIEW OF SYSTEMS: Constitutional: No fevers, chills, sweats, or change in appetite.   He has fatigue and insomnia Eyes: No visual changes, double vision, eye pain Ear, nose and throat: No hearing loss, ear pain, nasal congestion, sore throat Cardiovascular: No chest pain, palpitations Respiratory: No shortness of breath at rest or with exertion.   No wheezes GastrointestinaI: No nausea, vomiting, diarrhea, abdominal pain, fecal incontinence Genitourinary: No dysuria, urinary retention or frequency.  No nocturia. Musculoskeletal: No neck pain.  Notes right hip > back pain Integumentary: No rash, pruritus, skin lesions Neurological: as above Psychiatric: Notes mild depression at this time.  No anxiety Endocrine: No palpitations, diaphoresis,  change in appetite, change in weigh or increased thirst Hematologic/Lymphatic: No anemia, purpura, petechiae. Allergic/Immunologic: No itchy/runny eyes, nasal congestion, recent allergic reactions, rashes  ALLERGIES: Allergies  Allergen Reactions  . Campath [Alemtuzumab] Hives  . Avonex [Interferon Beta-1a] Rash  . Betaseron [Interferon Beta-1b] Rash  . Tysabri [Natalizumab] Rash    HOME MEDICATIONS:  Current Outpatient Medications:  .  baclofen (LIORESAL) 10 MG tablet, Take 1 tablet (10 mg total) by mouth 4 (four) times daily., Disp: 120 each, Rfl: 11 .  cyclobenzaprine (FLEXERIL) 5 MG tablet, One to two at bedtime, Disp: 60 tablet, Rfl: 11 .  Dimethyl Fumarate 240 MG CPDR, Take 240 mg by mouth., Disp: , Rfl:  .  gabapentin (NEURONTIN) 300 MG capsule,  Take 1 capsule (300 mg total) by mouth 3 (three) times daily., Disp: 90 capsule, Rfl: 11 .  hydrOXYzine (ATARAX/VISTARIL) 10 MG tablet, Take 1 tablet (10 mg total) by mouth 3 (three) times daily as needed., Disp: 90 tablet, Rfl: 5 .  ibuprofen (ADVIL,MOTRIN) 200 MG tablet, Take 200 mg by mouth every 6 (six) hours as needed., Disp: , Rfl:  .  meclizine (ANTIVERT) 25 MG tablet, Take 25 mg by mouth 2 (two) times daily as needed for dizziness., Disp: , Rfl:  .  oxybutynin (DITROPAN) 5 MG tablet, Take 1 tablet (5 mg total) by mouth 2 (two) times daily., Disp: 60 tablet, Rfl: 11 .  Dimethyl Fumarate 120 & 240 MG MISC, Take by mouth., Disp: , Rfl:  .  doxepin (SINEQUAN) 10 MG capsule, Take 1 capsule (10 mg total) by mouth at bedtime., Disp: 30 capsule, Rfl: 11 .  etodolac (LODINE) 400 MG tablet, Take 1 tablet (400 mg total) by mouth 2 (two) times daily., Disp: 60 tablet, Rfl: 5 .  montelukast (SINGULAIR) 10 MG tablet, Take 1 tablet (10 mg total) by mouth at bedtime., Disp: 30 tablet, Rfl: 11 .  omeprazole (PRILOSEC) 20 MG capsule, Take 20 mg by mouth daily., Disp: , Rfl:  .  Oxycodone HCl 10 MG TABS, Take 10 mg by mouth 4 (four) times daily as  needed., Disp: , Rfl:   PAST MEDICAL HISTORY: Past Medical History:  Diagnosis Date  . Atrial tachycardia (HCC)   . Basal cell carcinoma of face   . Cancer (HCC)   . Headache   . Multiple sclerosis (HCC)   . Vision abnormalities     PAST SURGICAL HISTORY: Past Surgical History:  Procedure Laterality Date  . CARDIAC ELECTROPHYSIOLOGY MAPPING AND ABLATION    . SKIN CANCER EXCISION  2016   nose  . TONSILLECTOMY      FAMILY HISTORY: Family History  Problem Relation Age of Onset  . Healthy Mother   . Stroke Father   . Heart attack Father   . Leukemia Father     SOCIAL HISTORY:  Social History   Socioeconomic History  . Marital status: Married    Spouse name: Not on file  . Number of children: Not on file  . Years of education: Not on file  . Highest education level: Not on file  Social Needs  . Financial resource strain: Not on file  . Food insecurity - worry: Not on file  . Food insecurity - inability: Not on file  . Transportation needs - medical: Not on file  . Transportation needs - non-medical: Not on file  Occupational History  . Not on file  Tobacco Use  . Smoking status: Current Every Day Smoker  . Smokeless tobacco: Never Used  Substance and Sexual Activity  . Alcohol use: Yes    Alcohol/week: 0.0 oz    Comment: occasional beer  . Drug use: No  . Sexual activity: Not on file  Other Topics Concern  . Not on file  Social History Narrative  . Not on file     PHYSICAL EXAM  Vitals:   09/01/17 1133  BP: 134/83  Pulse: 73  Weight: 208 lb (94.3 kg)  Height: 6\' 1"  (1.854 m)    Body mass index is 27.44 kg/m.   General: The patient is well-developed and well-nourished and in no acute distress   Neurologic Exam  Mental status: The patient is alert and oriented x 3 at the time of the examination. The  patient has apparent normal recent and remote memory, with reuced attention span and concentration ability.   Speech is normal.  Cranial  nerves: Extraocular movements are full. He has a 1+ left APD.  Color vision is slightly reduced on left.      Facial symmetry is present. Facial strength and sensation is normal. Trapezius strength is strong..  The tongue is midline, and the patient has symmetric elevation of the soft palate. No obvious hearing deficits are noted.  Motor:  Muscle bulk is normal.  Muscle tone is increased in the legs. Strength is 4/5 in the toe and ankle extensors..   Sensory: Sensory testing is intact to touch and sensation.  Coordination: Finger-nose-finger is performed well. Heel-to-shin is performed poorly on both legs. Gait and station: Station is normal.   His gait is mildly wide and the tandem gait is wide. Romberg is negative.   Reflexes: Deep tendon reflexes are symmetric and increased in legs bilaterally. There was spread at the knees.  Plantar responses are flexor.    DIAGNOSTIC DATA (LABS, IMAGING, TESTING) - I reviewed patient records, labs, notes, testing and imaging myself where available.      ASSESSMENT AND PLAN  Multiple sclerosis (HCC) - Plan: CBC with Differential/Platelet, Comprehensive metabolic panel  Gait disturbance  High risk medication use  Cognitive disorder  Depression with anxiety   1.   Resume Tecfidera, he had difficulty with skin reactions on Tecfidera when he took it last year. Some patients have benefited from the addition of Singulair and we will try that. We will check some blood work today. 2.   Continue baclofen and Flexeril for spasticity and gabapentin and Aleve for pain.  3.   He will continue oxybutynin for his bladder issues.  4.   Due to the combination of his MS related physical and cognitive impairments and fatigue, Mr. Leroy is unable to work.  5.   If fatigue is not better after starting the nigh time medications that may help sleep, then consider a stimulant such as phentermine or Adderall (Nuvigil had not helped in the past). 6.   Lexapro for mood  (dperession and anxiety)   Return to see me in 6 months or sooner if there are new or worsening neurologic symptoms.  45 minutes face-to-face interaction with greater than 1/2 the time counseling and coordinating care about his MS, choice of disease modifying therapies, disability issues and other MS related symptoms   Annagrace Carr A. Epimenio Foot, MD, PhD 09/01/2017, 12:25 PM Certified in Neurology, Clinical Neurophysiology, Sleep Medicine, Pain Medicine and Neuroimaging  [facility] [address] [place], [place] [address] [phone]

## 2017-09-02 ENCOUNTER — Telehealth: Payer: Self-pay | Admitting: *Deleted

## 2017-09-02 LAB — COMPREHENSIVE METABOLIC PANEL
A/G RATIO: 1.9 (ref 1.2–2.2)
ALT: 17 IU/L (ref 0–44)
AST: 17 IU/L (ref 0–40)
Albumin: 4.4 g/dL (ref 3.5–5.5)
Alkaline Phosphatase: 63 IU/L (ref 39–117)
BUN/Creatinine Ratio: 10 (ref 9–20)
BUN: 10 mg/dL (ref 6–24)
Bilirubin Total: 0.7 mg/dL (ref 0.0–1.2)
CALCIUM: 8.9 mg/dL (ref 8.7–10.2)
CO2: 21 mmol/L (ref 20–29)
Chloride: 103 mmol/L (ref 96–106)
Creatinine, Ser: 1.01 mg/dL (ref 0.76–1.27)
GFR, EST AFRICAN AMERICAN: 96 mL/min/{1.73_m2} (ref >59)
GFR, EST NON AFRICAN AMERICAN: 83 mL/min/{1.73_m2} (ref >59)
Globulin, Total: 2.3 g/dL (ref 1.5–4.5)
Glucose: 92 mg/dL (ref 65–99)
POTASSIUM: 4 mmol/L (ref 3.5–5.2)
Sodium: 141 mmol/L (ref 134–144)
Total Protein: 6.7 g/dL (ref 6.0–8.5)

## 2017-09-02 LAB — CBC WITH DIFFERENTIAL/PLATELET
BASOS: 0 %
Basophils Absolute: 0 10*3/uL (ref 0.0–0.2)
EOS (ABSOLUTE): 0.1 10*3/uL (ref 0.0–0.4)
EOS: 1 %
Hematocrit: 40.2 % (ref 37.5–51.0)
Hemoglobin: 13.8 g/dL (ref 13.0–17.7)
Immature Grans (Abs): 0 10*3/uL (ref 0.0–0.1)
Immature Granulocytes: 0 %
Lymphocytes Absolute: 0.8 10*3/uL (ref 0.7–3.1)
Lymphs: 13 %
MCH: 32.7 pg (ref 26.6–33.0)
MCHC: 34.3 g/dL (ref 31.5–35.7)
MCV: 95 fL (ref 79–97)
Monocytes Absolute: 0.4 10*3/uL (ref 0.1–0.9)
Monocytes: 6 %
NEUTROS PCT: 80 %
Neutrophils Absolute: 4.9 10*3/uL (ref 1.4–7.0)
PLATELETS: 178 10*3/uL (ref 150–379)
RBC: 4.22 x10E6/uL (ref 4.14–5.80)
RDW: 13.5 % (ref 12.3–15.4)
WBC: 6.2 10*3/uL (ref 3.4–10.8)

## 2017-09-02 NOTE — Telephone Encounter (Signed)
VM full/fim 

## 2017-09-02 NOTE — Telephone Encounter (Signed)
-----   Message from Britt Bottom, MD sent at 09/02/2017 11:19 AM EST ----- Please let the patient know that the lab work is fine.

## 2017-09-03 ENCOUNTER — Other Ambulatory Visit: Payer: Self-pay | Admitting: Neurology

## 2017-09-08 ENCOUNTER — Telehealth: Payer: Self-pay | Admitting: *Deleted

## 2017-09-08 MED ORDER — GABAPENTIN 300 MG PO CAPS: 300.0000 mg | ORAL_CAPSULE | Freq: Three times a day (TID) | ORAL | 3 refills | Status: DC

## 2017-09-08 NOTE — Telephone Encounter (Signed)
Gabapentin escribed to [facility] in response to faxed request from them/fim 

## 2017-09-30 ENCOUNTER — Telehealth: Payer: Self-pay | Admitting: *Deleted

## 2017-09-30 MED ORDER — ESCITALOPRAM OXALATE 20 MG PO TABS: 20.0000 mg | ORAL_TABLET | Freq: Every day | ORAL | 1 refills | Status: DC

## 2017-09-30 NOTE — Telephone Encounter (Signed)
Escitalopram rx. converted to 90 day supply and escribed to [facility] in response to faxed request from them/fim

## 2018-03-01 ENCOUNTER — Ambulatory Visit: Payer: Medicare Other | Admitting: Neurology

## 2018-03-22 ENCOUNTER — Encounter: Payer: Self-pay | Admitting: Neurology

## 2018-03-22 ENCOUNTER — Other Ambulatory Visit: Payer: Self-pay

## 2018-03-22 ENCOUNTER — Ambulatory Visit: Payer: Medicare Other | Admitting: Neurology

## 2018-03-22 VITALS — BP 142/86 | HR 62 | Resp 16 | Ht 73.0 in | Wt 201.5 lb

## 2018-03-22 DIAGNOSIS — R269 Unspecified abnormalities of gait and mobility: Secondary | ICD-10-CM

## 2018-03-22 DIAGNOSIS — Z79899 Other long term (current) drug therapy: Secondary | ICD-10-CM

## 2018-03-22 DIAGNOSIS — R5383 Other fatigue: Secondary | ICD-10-CM | POA: Diagnosis not present

## 2018-03-22 DIAGNOSIS — F09 Unspecified mental disorder due to known physiological condition: Secondary | ICD-10-CM | POA: Diagnosis not present

## 2018-03-22 DIAGNOSIS — G35 Multiple sclerosis: Secondary | ICD-10-CM | POA: Diagnosis not present

## 2018-03-22 MED ORDER — AMPHETAMINE-DEXTROAMPHETAMINE 10 MG PO TABS: 10.0000 mg | ORAL_TABLET | Freq: Two times a day (BID) | ORAL | 0 refills | Status: DC

## 2018-03-22 NOTE — Progress Notes (Signed)
[facility]  PATIENT: Don Hamilton DOB: 02/20/1961  REFERRING DOCTOR OR PCP:  None SOURCE: patient, records from [facility].  _________________________________   HISTORICAL  CHIEF COMPLAINT:  Chief Complaint  Patient presents with  . Multiple Sclerosis    Sts. he had an allergic rxn to Tecfidera (hives, burning) so stopped it 3 mos. ago. Would like to discuss other tx. options/fim     HISTORY Don Hamilton is a 57 year old man with multiple sclerosis.  Update 03/22/2018: He tried Tecfidera again but had difficulties with possible allergic reaction with hives and burning in the skin so he stopped after a few months (last dose June 2019).  Before that, he was on alemtuzumab but had severe infusion reactions with hives for several weeks.  He also had infusion reactions on Tysabri.  Earlier, he was on Betaseron, Aubagio but had difficulty tolerating them.   Due to some EKG changes, Gilenya was considered but not tried..   We had a conversation about some of the treatment options including Mavenclad and Mayzent.  He feels his gait is doing about the same as earlier this year.  He is unstable and uses a cane.  He notes some weakness in his legs and mild spasticity.  He notes a lot of fatigue and insomnia is also doing worse.  He gets some benefit from Adderall for the fatigue and needs a new refill.  He felt he did better with the immediate release Adderall than the time release.  Doxepin helps the insomnia some.  Oxybutynin has helped his urinary frequency.   Update 09/01/2017:   He started the Tecfidera but stopped  a couple months ago because he was having side effects including flushing and rash/hives.  The rash would last the whole day.   Of note, he did have one course of treatment with alemtuzumab in 2011 (off label at that time) but did not have the second years infusion due to infusion reactions.     Before alemtuzumab, he was on Tysabri but had severe infusion  reactions and needed to stop.   He also had been at various times on Betaseron, Aubagio, Gilenya.    He is having difficulty with his gait and relies on a cane constantly now. Going up and down stairs is difficult. He stumbles and has occasional falls. He has spasticity mostly in the legs. Baclofen gives him some benefit. He continues to have some difficulty with bladder frequency and urgency. This is helped a little bit with the oxybutynin.   Vision is doing about the same (mild reduced on left). The patient is doing about the same as last visit.  He feels his sleep and fatigue are both doing worse. Insomnia has been a chronic issue. He does sleep a little bit better when he takes gabapentin, Flexeril and baclofen at bedtime.    He tried Nuvigil for fatigue but there was no benefit.     Depression and anxiety are both worsening. Years ago he was on Lexapro with some benefit.  He does have basal cell carcinoma (nose) and had surgery.   From 08/04/2016: MS:   He remains off any DMT.   He was on Alemtuzumab about 2011 but had side effects and never had the second year.    Gait/strength/sensation/Pain: He has had difficulty with his gait since 2000. About 10 years ago he started to use a cane.  He continues to use a cane.   Notes transient numbness in the arms and legs.  The numbness and tingling is fairly random many does not identify a trigger.   He ran out of baclofen and felt that it did help his spasticity.      Pain:  He notes pain in the right greater than left leg associated with spasticity. Hip area is most painful.   This has been evaluated and an orthopedic reason has not been identified.    He also notes neck and shoulder pain.    He currently is taking ibuprofen 800 mg a few times a day.   He takes Aleve.   He had a UDS that was positive for cocaine in 2016.  He denies any use of it.  We discussed that for the time being I can't write any controlled substances for him.     Bladder:   His  bladder function is doing weld he tolerates it welll on oxybutynin . There is just minimal frequency and he has nocturia x 1.  Vision: He notes mildly reduced visual acuity out of both eyes. He has altered color vision out of the left eye.  Fatigue/sleep:   He reports some cognitive and physical fatigue. He had mild benefit for the fatigue with the Adderall but it was not well tolerated. Nuvigil was tried in the past but was not helpful. He reports difficulty falling and staying asleep.  He did not take the doxepin.   He feels unrefreshed in the morning.   A PSG did not show OSA.   Mood/cognition: He notes mild depression and some anxiety.  Anti depressants had not helped much so he stopped.   He was working as an Art gallery manager in the past.   Cognitive issues are his main concernHe has difficulty with cognition, mostly problems with reduced attention, short-term memory and executive function.    MS History:   He presented in 1998 with left optic neuritis. An MRI of the brain was consistent with multiple sclerosis. He was started on Betaseron but he stopped due to allergic reactions and injection site reactions. Avonex was tried but since he stopped after a while due to rash. When Tysabri was reintroduced around 2006, he went on that for about a year or so. However, he would get a rash with each infusion. Therefore, it was discontinued and he was placed on off label Campath. He received his first year dose but had severe infusion reactions with rash that lasted over a month. Therefore, sucking year Campath was not performed. In 2015, he was placed on Aubagio. After month it was stopped due to diarrhea he has not been on Tecfidera. Gilenya was considered but he does have a cardiac arrhythmia. Last year, Dr. Alton Revere placed him on Copaxone but he decided not to take it as it is generally weaker than other medications he has been on.         REVIEW OF SYSTEMS: Constitutional: No fevers, chills, sweats, or change  in appetite.   He has fatigue and insomnia Eyes: No visual changes, double vision, eye pain Ear, nose and throat: No hearing loss, ear pain, nasal congestion, sore throat Cardiovascular: No chest pain, palpitations Respiratory: No shortness of breath at rest or with exertion.   No wheezes GastrointestinaI: No nausea, vomiting, diarrhea, abdominal pain, fecal incontinence Genitourinary:He has urinary frequency.. Musculoskeletal: No neck pain.  Notes right hip > back pain Integumentary: No rash, pruritus, skin lesions Neurological: as above Psychiatric: Notes mild depression at this time.  No anxiety Endocrine: No palpitations, diaphoresis, change in appetite, change  in weigh or increased thirst Hematologic/Lymphatic: No anemia, purpura, petechiae. Allergic/Immunologic: No itchy/runny eyes, nasal congestion, recent allergic reactions, rashes  ALLERGIES: Allergies  Allergen Reactions  . Campath [Alemtuzumab] Hives  . Avonex [Interferon Beta-1a] Rash  . Betaseron [Interferon Beta-1b] Rash  . Tysabri [Natalizumab] Rash    HOME MEDICATIONS:  Current Outpatient Medications:  .  baclofen (LIORESAL) 10 MG tablet, Take 1 tablet (10 mg total) by mouth 4 (four) times daily., Disp: 120 each, Rfl: 11 .  cyclobenzaprine (FLEXERIL) 5 MG tablet, One to two at bedtime, Disp: 60 tablet, Rfl: 11 .  doxepin (SINEQUAN) 10 MG capsule, Take 1 capsule (10 mg total) by mouth at bedtime., Disp: 30 capsule, Rfl: 11 .  escitalopram (LEXAPRO) 20 MG tablet, Take 1 tablet (20 mg total) by mouth daily., Disp: 90 tablet, Rfl: 1 .  gabapentin (NEURONTIN) 300 MG capsule, Take 1 capsule (300 mg total) by mouth 3 (three) times daily., Disp: 270 capsule, Rfl: 3 .  hydrOXYzine (ATARAX/VISTARIL) 10 MG tablet, Take 1 tablet (10 mg total) by mouth 3 (three) times daily as needed., Disp: 90 tablet, Rfl: 5 .  meclizine (ANTIVERT) 25 MG tablet, Take 25 mg by mouth 2 (two) times daily as needed for dizziness., Disp: , Rfl:    .  oxybutynin (DITROPAN) 5 MG tablet, Take 1 tablet (5 mg total) by mouth 2 (two) times daily., Disp: 60 tablet, Rfl: 11  PAST MEDICAL HISTORY: Past Medical History:  Diagnosis Date  . Atrial tachycardia (HCC)   . Basal cell carcinoma of face   . Cancer (HCC)   . Headache   . Multiple sclerosis (HCC)   . Vision abnormalities     PAST SURGICAL HISTORY: Past Surgical History:  Procedure Laterality Date  . CARDIAC ELECTROPHYSIOLOGY MAPPING AND ABLATION    . SKIN CANCER EXCISION  2016   nose  . TONSILLECTOMY      FAMILY HISTORY: Family History  Problem Relation Age of Onset  . Healthy Mother   . Stroke Father   . Heart attack Father   . Leukemia Father     SOCIAL HISTORY:  Social History   Socioeconomic History  . Marital status: Married    Spouse name: Not on file  . Number of children: Not on file  . Years of education: Not on file  . Highest education level: Not on file  Occupational History  . Not on file  Social Needs  . Financial resource strain: Not on file  . Food insecurity:    Worry: Not on file    Inability: Not on file  . Transportation needs:    Medical: Not on file    Non-medical: Not on file  Tobacco Use  . Smoking status: Current Every Day Smoker  . Smokeless tobacco: Never Used  Substance and Sexual Activity  . Alcohol use: Yes    Alcohol/week: 0.0 standard drinks    Comment: occasional beer  . Drug use: No  . Sexual activity: Not on file  Lifestyle  . Physical activity:    Days per week: Not on file    Minutes per session: Not on file  . Stress: Not on file  Relationships  . Social connections:    Talks on phone: Not on file    Gets together: Not on file    Attends religious service: Not on file    Active member of club or organization: Not on file    Attends meetings of clubs or organizations: Not on file  Relationship status: Not on file  . Intimate partner violence:    Fear of current or ex partner: Not on file     Emotionally abused: Not on file    Physically abused: Not on file    Forced sexual activity: Not on file  Other Topics Concern  . Not on file  Social History Narrative  . Not on file     PHYSICAL EXAM  Vitals:   03/22/18 1305  BP: (!) 142/86  Pulse: 62  Resp: 16  Weight: 201 lb 8 oz (91.4 kg)  Height: 6\' 1"  (1.854 m)    Body mass index is 26.58 kg/m.   General: The patient is well-developed and well-nourished and in no acute distress   Neurologic Exam  Mental status: The patient is alert and oriented x 3 at the time of the examination. The patient has apparent normal recent and remote memory, with reuced attention span and concentration ability.   Speech is normal.  Cranial nerves: Extraocular movements are full. He has a 1+ left APD.  Color vision is slightly reduced on left.      Facial symmetry is present. Facial strength and sensation is normal. Trapezius strength is strong..  The tongue is midline, and the patient has symmetric elevation of the soft palate. No obvious hearing deficits are noted.  Motor:  Muscle bulk is normal.  Muscle tone is increased in the legs. Strength is 4/5 in the toe and ankle extensors..   Sensory: Sensory testing is intact to touch and sensation.  Coordination: Finger-nose-finger is performed well. Heel-to-shin is performed poorly on both legs.   Gait and station: Station is normal.  Gait is mildly wide.  The tandem gait is wide.  Romberg is negative.  He walks better with a cane.  Reflexes: Deep tendon reflexes are symmetric and increased in legs bilaterally. There was spread at the knees.  Plantar responses are flexor.    DIAGNOSTIC DATA (LABS, IMAGING, TESTING) - I reviewed patient records, labs, notes, testing and imaging myself where available.      ASSESSMENT AND PLAN  Multiple sclerosis (HCC)  High risk medication use  Cognitive disorder  Other fatigue  Gait disturbance   1.   We discussed various treatment  options.  He is most interested in trying Mavenclad we went over the risks and benefits.  We will check some blood work to see if he is a candidate.  He would also consider Mayzent.. 2.   Continue baclofen and Flexeril for spasticity and gabapentin and Aleve for pain.  3.   He will continue oxybutynin for his bladder issues.  4.   Due to the combination of his MS related physical and cognitive impairments and fatigue, Mr. Mcqueary is unable to work.  5.   Adderall 10 mg twice a day.   Return to see me in 3-4 months or sooner if there are new or worsening neurologic symptoms.    Domingue Coltrain A. Epimenio Foot, MD, PhD 03/22/2018, 1:40 PM Certified in Neurology, Clinical Neurophysiology, Sleep Medicine, Pain Medicine and Neuroimaging  [facility] [address], [address] [place], [place] [address] [phone]

## 2018-03-26 LAB — HEPATITIS C ANTIBODY

## 2018-03-26 LAB — HEPATITIS B SURFACE ANTIBODY,QUALITATIVE: Hep B Surface Ab, Qual: NONREACTIVE

## 2018-03-26 LAB — QUANTIFERON-TB GOLD PLUS
QUANTIFERON MITOGEN VALUE: 1.3 [IU]/mL
QUANTIFERON NIL VALUE: 0.03 [IU]/mL
QUANTIFERON TB2 AG VALUE: 0.03 [IU]/mL
QuantiFERON TB1 Ag Value: 0.03 IU/mL
QuantiFERON-TB Gold Plus: NEGATIVE

## 2018-03-26 LAB — COMPREHENSIVE METABOLIC PANEL
ALK PHOS: 63 IU/L (ref 39–117)
ALT: 12 IU/L (ref 0–44)
AST: 15 IU/L (ref 0–40)
Albumin/Globulin Ratio: 2.1 (ref 1.2–2.2)
Albumin: 4.5 g/dL (ref 3.5–5.5)
BILIRUBIN TOTAL: 0.7 mg/dL (ref 0.0–1.2)
BUN/Creatinine Ratio: 8 — ABNORMAL LOW (ref 9–20)
BUN: 8 mg/dL (ref 6–24)
CHLORIDE: 98 mmol/L (ref 96–106)
CO2: 23 mmol/L (ref 20–29)
CREATININE: 1.02 mg/dL (ref 0.76–1.27)
Calcium: 9.3 mg/dL (ref 8.7–10.2)
GFR calc Af Amer: 94 mL/min/{1.73_m2} (ref >59)
GFR calc non Af Amer: 81 mL/min/{1.73_m2} (ref >59)
GLUCOSE: 90 mg/dL (ref 65–99)
Globulin, Total: 2.1 g/dL (ref 1.5–4.5)
Potassium: 4 mmol/L (ref 3.5–5.2)
Sodium: 139 mmol/L (ref 134–144)
Total Protein: 6.6 g/dL (ref 6.0–8.5)

## 2018-03-26 LAB — HEPATITIS B CORE ANTIBODY, TOTAL: HEP B C TOTAL AB: NEGATIVE

## 2018-03-26 LAB — HEPATITIS B SURFACE ANTIGEN: Hepatitis B Surface Ag: NEGATIVE

## 2018-03-26 LAB — HIV ANTIBODY (ROUTINE TESTING W REFLEX): HIV SCREEN 4TH GENERATION: NONREACTIVE

## 2018-03-26 LAB — VARICELLA ZOSTER ANTIBODY, IGG: VARICELLA: 632 {index} (ref >165)

## 2018-03-27 ENCOUNTER — Encounter: Payer: Self-pay | Admitting: *Deleted

## 2018-03-28 ENCOUNTER — Telehealth: Payer: Self-pay | Admitting: *Deleted

## 2018-03-28 NOTE — Telephone Encounter (Signed)
PA for [facility] 10mg  tablets, #9 tabs for month 1, #9 tabs for month 2, completed and faxed to [facility], fax# [phone]. Dx: RRMS (G35). Tried and failed meds: Betaseron (1998)-Allergic rxn/skin site rxns, Avonex (1998) Allergy--rash, Tysabri (2006-2007--Allergy--Rash, Campath (2007)--allergy-rash, Aubagio (2017)--intolerance (diarrhea), Tecfidera (2018-2019), Allergy--hives, Gilenya-unsure of exact dates--Allergy--rash, Lemtrada (20211)--Allergy--Rash/fim

## 2018-03-28 NOTE — Telephone Encounter (Signed)
Fax received from [facility], phone# [phone]. Mavenclad pack 10mg  (9 tabs) approved for non-formulary exception thru 07/04/18 under pt's Medicare part D benefit. Ref# [id number]./PBD

## 2018-04-17 ENCOUNTER — Ambulatory Visit: Payer: Self-pay | Admitting: Neurology

## 2018-04-19 ENCOUNTER — Telehealth: Payer: Self-pay | Admitting: *Deleted

## 2018-04-19 NOTE — Telephone Encounter (Signed)
Fax received from [facility]. Pt's has been approved to participate for a period of up to one year in the [place] Patient Assistance Program for Medicare Part D Beneficiaries Prescribed Mavenclad. PAP good for dates 04/19/18 to 07/04/18. Questions may be directed to [facility], phone# [phone]/fim

## 2018-04-25 NOTE — Telephone Encounter (Signed)
Patient called in stating he has received his [place] and would like to come in next Monday afternoon.

## 2018-04-25 NOTE — Telephone Encounter (Signed)
I called pt back. Scheduled him an appt for 05/02/18 at 1130am with Dr. Felecia Shelling for first dose of [place]. He will also see research. He is in a study for this.

## 2018-05-01 NOTE — Telephone Encounter (Signed)
Santiago Glad [facility] [employer] has called stating she has left pt 3 messages and has not heard back.  She has tried reaching pt to check with him re: him taking his medications.  Santiago Glad is asking RN to call her back she will be away from her desk from 12:30-1:30 please leave message if she is not available

## 2018-05-01 NOTE — Telephone Encounter (Signed)
I called patient back. Advised I spoke with Dr. Felecia Shelling and he said as long as he does not have a fever, he can still come to appt as scheduled. Pt denies fever. Will come to appt tomorrow. Nothing further needed. He also called [facility] [employer] back.

## 2018-05-01 NOTE — Telephone Encounter (Addendum)
I called Santiago Glad back. She stated they were trying to contact the patient to verify he received mavenclad shipment. They are showing it should have arrived to him on 04-25-18. She tried calling him several times and he has not responded.   I verified I spoke with him that day and he received. He has appt tomorrow with Dr. Vale Haven. She states it is important they are made aware of his start date. This helps them calculate when to send his second dose.   She also asked when he stopped Tecfidera. Advised it was documented in last note on 03-22-18 that he stopped 3 months prior to that.  Advised I will call pt to have him f/u with them. She verbalized understanding and appreciation.

## 2018-05-01 NOTE — Telephone Encounter (Signed)
Called, LVM for pt letting him know [employer] has been trying to reach him to discuss receiving mavenclad shipment. Asked him to call them back at [phone]. He can ask to speak with Santiago Glad who I was speaking with or any nurse.

## 2018-05-01 NOTE — Telephone Encounter (Signed)
Pt has called back stating he has laryngitis , he'd like a call back advising if he should come tomorrow for [facility]

## 2018-05-02 ENCOUNTER — Encounter: Payer: Self-pay | Admitting: Neurology

## 2018-05-02 ENCOUNTER — Ambulatory Visit: Payer: Medicare Other | Admitting: Neurology

## 2018-05-02 VITALS — BP 138/83 | HR 65 | Ht 73.0 in | Wt 198.0 lb

## 2018-05-02 DIAGNOSIS — Z79899 Other long term (current) drug therapy: Secondary | ICD-10-CM | POA: Diagnosis not present

## 2018-05-02 DIAGNOSIS — R5383 Other fatigue: Secondary | ICD-10-CM | POA: Diagnosis not present

## 2018-05-02 DIAGNOSIS — R269 Unspecified abnormalities of gait and mobility: Secondary | ICD-10-CM

## 2018-05-02 DIAGNOSIS — G35 Multiple sclerosis: Secondary | ICD-10-CM

## 2018-05-02 NOTE — Progress Notes (Signed)
[facility]  PATIENT: Don Hamilton DOB: 1960-08-10  REFERRING DOCTOR OR PCP:  None SOURCE: patient, records from [facility].  _________________________________   HISTORICAL  CHIEF COMPLAINT:  Chief Complaint  Patient presents with  . Follow-up    RM 12, alone. Last seen 03/22/18. Ready to start mavenclad. He is in a research trial for this at [facility].   . Gait Problem    No recent falls. Ambulates with cane.   . Eye Problem    Pt in first stages of glaucoma. Thinks he needs new glasses.     HISTORY Don Hamilton is a 57 year old man with multiple sclerosis.  Update 05/02/2018: He will be starting Mavenclad and he finally got his medication.     He will be followed in the [facility] study ([facility]).   His last medication was Tecfidera but he had rash.      Gait is doing the same ans he uses a cane.   Some stumbles but no falls.   His right leg is weaker, stiffer and more painful than the left.    Some numbness in the right leg and both ankles.      Update 03/22/2018: He tried Tecfidera again but had difficulties with possible allergic reaction with hives and burning in the skin so he stopped after a few months (last dose June 2019).  Before that, he was on alemtuzumab but had severe infusion reactions with hives for several weeks.  He also had infusion reactions on Tysabri.  Earlier, he was on Betaseron, Aubagio but had difficulty tolerating them.   Due to some EKG changes, Gilenya was considered but not tried..   We had a conversation about some of the treatment options including Mavenclad and Mayzent.  He feels his gait is doing about the same as earlier this year.  He is unstable and uses a cane.  He notes some weakness in his legs and mild spasticity.  He notes a lot of fatigue and insomnia is also doing worse.  He gets some benefit from Adderall for the fatigue and needs a new refill.  He felt he did better with the immediate release Adderall than the time  release.  Doxepin helps the insomnia some.  Oxybutynin has helped his urinary frequency.   Update 09/01/2017:   He started the Tecfidera but stopped  a couple months ago because he was having side effects including flushing and rash/hives.  The rash would last the whole day.   Of note, he did have one course of treatment with alemtuzumab in 2011 (off label at that time) but did not have the second years infusion due to infusion reactions.     Before alemtuzumab, he was on Tysabri but had severe infusion reactions and needed to stop.   He also had been at various times on Betaseron, Aubagio, Gilenya.    He is having difficulty with his gait and relies on a cane constantly now. Going up and down stairs is difficult. He stumbles and has occasional falls. He has spasticity mostly in the legs. Baclofen gives him some benefit. He continues to have some difficulty with bladder frequency and urgency. This is helped a little bit with the oxybutynin.   Vision is doing about the same (mild reduced on left). The patient is doing about the same as last visit.  He feels his sleep and fatigue are both doing worse. Insomnia has been a chronic issue. He does sleep a little bit better when he takes gabapentin,  Flexeril and baclofen at bedtime.    He tried Nuvigil for fatigue but there was no benefit.     Depression and anxiety are both worsening. Years ago he was on Lexapro with some benefit.  He does have basal cell carcinoma (nose) and had surgery.   From 08/04/2016: MS:   He remains off any DMT.   He was on Alemtuzumab about 2011 but had side effects and never had the second year.    Gait/strength/sensation/Pain: He has had difficulty with his gait since 2000. About 10 years ago he started to use a cane.  He continues to use a cane.   Notes transient numbness in the arms and legs.   The numbness and tingling is fairly random many does not identify a trigger.   He ran out of baclofen and felt that it did help his  spasticity.      Pain:  He notes pain in the right greater than left leg associated with spasticity. Hip area is most painful.   This has been evaluated and an orthopedic reason has not been identified.    He also notes neck and shoulder pain.    He currently is taking ibuprofen 800 mg a few times a day.   He takes Aleve.   He had a UDS that was positive for cocaine in 2016.  He denies any use of it.  We discussed that for the time being I can't write any controlled substances for him.     Bladder:   His bladder function is doing weld he tolerates it welll on oxybutynin . There is just minimal frequency and he has nocturia x 1.  Vision: He notes mildly reduced visual acuity out of both eyes. He has altered color vision out of the left eye.  Fatigue/sleep:   He reports some cognitive and physical fatigue. He had mild benefit for the fatigue with the Adderall but it was not well tolerated. Nuvigil was tried in the past but was not helpful. He reports difficulty falling and staying asleep.  He did not take the doxepin.   He feels unrefreshed in the morning.   A PSG did not show OSA.   Mood/cognition: He notes mild depression and some anxiety.  Anti depressants had not helped much so he stopped.   He was working as an Art gallery manager in the past.   Cognitive issues are his main concernHe has difficulty with cognition, mostly problems with reduced attention, short-term memory and executive function.    MS History:   He presented in 1998 with left optic neuritis. An MRI of the brain was consistent with multiple sclerosis. He was started on Betaseron but he stopped due to allergic reactions and injection site reactions. Avonex was tried but since he stopped after a while due to rash. When Tysabri was reintroduced around 2006, he went on that for about a year or so. However, he would get a rash with each infusion. Therefore, it was discontinued and he was placed on off label Campath. He received his first year dose  but had severe infusion reactions with rash that lasted over a month. Therefore, sucking year Campath was not performed. In 2015, he was placed on Aubagio. After month it was stopped due to diarrhea he has not been on Tecfidera. Gilenya was considered but he does have a cardiac arrhythmia. Last year, Dr. Alton Revere placed him on Copaxone but he decided not to take it as it is generally weaker than other medications he has  been on.         REVIEW OF SYSTEMS: Constitutional: No fevers, chills, sweats, or change in appetite.   He has fatigue and insomnia Eyes: No visual changes, double vision, eye pain Ear, nose and throat: No hearing loss, ear pain, nasal congestion, sore throat Cardiovascular: No chest pain, palpitations Respiratory: No shortness of breath at rest or with exertion.   No wheezes GastrointestinaI: No nausea, vomiting, diarrhea, abdominal pain, fecal incontinence Genitourinary:He has urinary frequency.. Musculoskeletal: No neck pain.  Notes right hip > back pain Integumentary: No rash, pruritus, skin lesions Neurological: as above Psychiatric: Currently denies depression or anxiety. Endocrine: No palpitations, diaphoresis, change in appetite, change in weigh or increased thirst Hematologic/Lymphatic: No anemia, purpura, petechiae. Allergic/Immunologic: No itchy/runny eyes, nasal congestion, recent allergic reactions, rashes  ALLERGIES: Allergies  Allergen Reactions  . Campath [Alemtuzumab] Hives  . Avonex [Interferon Beta-1a] Rash  . Betaseron [Interferon Beta-1b] Rash  . Tysabri [Natalizumab] Rash    HOME MEDICATIONS:  Current Outpatient Medications:  .  amphetamine-dextroamphetamine (ADDERALL) 10 MG tablet, Take 1 tablet (10 mg total) by mouth 2 (two) times daily with a meal., Disp: 60 tablet, Rfl: 0 .  baclofen (LIORESAL) 10 MG tablet, Take 1 tablet (10 mg total) by mouth 4 (four) times daily., Disp: 120 each, Rfl: 11 .  Cladribine, 9 Tabs, (MAVENCLAD, 9 TABS,) 10 MG  TBPK, Take 2 mg by mouth., Disp: , Rfl:  .  cyclobenzaprine (FLEXERIL) 5 MG tablet, One to two at bedtime, Disp: 60 tablet, Rfl: 11 .  doxepin (SINEQUAN) 10 MG capsule, Take 1 capsule (10 mg total) by mouth at bedtime., Disp: 30 capsule, Rfl: 11 .  escitalopram (LEXAPRO) 20 MG tablet, Take 1 tablet (20 mg total) by mouth daily., Disp: 90 tablet, Rfl: 1 .  gabapentin (NEURONTIN) 300 MG capsule, Take 1 capsule (300 mg total) by mouth 3 (three) times daily., Disp: 270 capsule, Rfl: 3 .  hydrOXYzine (ATARAX/VISTARIL) 10 MG tablet, Take 1 tablet (10 mg total) by mouth 3 (three) times daily as needed., Disp: 90 tablet, Rfl: 5 .  meclizine (ANTIVERT) 25 MG tablet, Take 25 mg by mouth 2 (two) times daily as needed for dizziness., Disp: , Rfl:  .  oxybutynin (DITROPAN) 5 MG tablet, Take 1 tablet (5 mg total) by mouth 2 (two) times daily., Disp: 60 tablet, Rfl: 11  PAST MEDICAL HISTORY: Past Medical History:  Diagnosis Date  . Atrial tachycardia (HCC)   . Basal cell carcinoma of face   . Cancer (HCC)   . Headache   . Multiple sclerosis (HCC)   . Vision abnormalities     PAST SURGICAL HISTORY: Past Surgical History:  Procedure Laterality Date  . CARDIAC ELECTROPHYSIOLOGY MAPPING AND ABLATION    . SKIN CANCER EXCISION  2016   nose  . TONSILLECTOMY      FAMILY HISTORY: Family History  Problem Relation Age of Onset  . Healthy Mother   . Stroke Father   . Heart attack Father   . Leukemia Father     SOCIAL HISTORY:  Social History   Socioeconomic History  . Marital status: Married    Spouse name: Not on file  . Number of children: Not on file  . Years of education: Not on file  . Highest education level: Not on file  Occupational History  . Not on file  Social Needs  . Financial resource strain: Not on file  . Food insecurity:    Worry: Not on file  Inability: Not on file  . Transportation needs:    Medical: Not on file    Non-medical: Not on file  Tobacco Use  . Smoking  status: Current Every Day Smoker  . Smokeless tobacco: Never Used  Substance and Sexual Activity  . Alcohol use: Yes    Alcohol/week: 0.0 standard drinks    Comment: occasional beer  . Drug use: No  . Sexual activity: Not on file  Lifestyle  . Physical activity:    Days per week: Not on file    Minutes per session: Not on file  . Stress: Not on file  Relationships  . Social connections:    Talks on phone: Not on file    Gets together: Not on file    Attends religious service: Not on file    Active member of club or organization: Not on file    Attends meetings of clubs or organizations: Not on file    Relationship status: Not on file  . Intimate partner violence:    Fear of current or ex partner: Not on file    Emotionally abused: Not on file    Physically abused: Not on file    Forced sexual activity: Not on file  Other Topics Concern  . Not on file  Social History Narrative  . Not on file     PHYSICAL EXAM  Vitals:   05/02/18 1154  BP: 138/83  Pulse: 65  Weight: 198 lb (89.8 kg)  Height: 6\' 1"  (1.854 m)    Body mass index is 26.12 kg/m.   General: The patient is well-developed and well-nourished and in no acute distress.   Extremities with some venous changes in foot/ankle and trace edema.    Skin is otherwise normal.  Lymph nodes are nonpalpable.  Thyroid appears normal.  HEENT: Visual acuity is 20/20 OD and 20/30 OS with correction.  Pharynx is nonerythematous.  CV:   The heart has a regular rate and rhythm normal S1 & S2.  no murmurs gallops or rubs.  The lungs are clear to auscultation.     Peripheral pulses are normal.  Abdomen: The abdomen is nontender.  Bowel sounds are present.   Neurologic Exam  Mental status: The patient is alert and oriented x 3 at the time of the examination. The patient has apparent normal recent and remote memory, with reuced attention span and concentration ability.   Speech is normal.  Cranial nerves: Extraocular movements  are full. He has a 1+ left APD.  Color vision is slightly reduced on left.      Facial symmetry is present. Facial strength and sensation is normal. Trapezius strength is strong..  The tongue is midline, and the patient has symmetric elevation of the soft palate. No obvious hearing deficits are noted.  Motor:  Muscle bulk is normal.  Muscle tone is increased in the legs. Strength is 4/5 in the right hip flexors and right toe and ankle extensors..   Sensory: Sensory testing reduced touch anefd vibration in left arm and leg.    Coordination: Finger-nose-finger is performed well. Heel-to-shin is performed poorly on both legs.   Gait and station: Station is normal.  Gait is mildly wide.  The tandem gait is wide.  Romberg is negative.  He walks better with a cane.   100 feet without cane. can go 300 yards by report with cane in 10 minutes.      Reflexes: Deep tendon reflexes are symmetric and increased in legs , R>L.  There was spread at the knees.  No ankle clonus plantar responses are flexor.  EDSS Visual: 2 Brainstem: 0 Pyramidal: 2 Cerebellar: 2 Sensory: 2 Bowel/bladder: 0 Cerebral: 2 Ambulatory: 6  EDSS step: 6.0     DIAGNOSTIC DATA (LABS, IMAGING, TESTING) - I reviewed patient records, labs, notes, testing and imaging myself where available.      ASSESSMENT AND PLAN  Multiple sclerosis (HCC)  High risk medication use  Gait disturbance  Other fatigue   1.   He will take his first dose of Mavenclad today.  General and neurologic examination performed.   2.   Continue baclofen and Flexeril for spasticity and gabapentin and Aleve for pain.  3.   He will continue oxybutynin for his bladder issues.  4.   Due to the combination of his MS related physical and cognitive impairments and fatigue, Mr. Reilley is unable to work.    Return to see me in 2 months or sooner if there are new or worsening neurologic symptoms.    Mariam Helbert A. Epimenio Foot, MD, PhD 05/02/2018, 3:18 PM Certified  in Neurology, Clinical Neurophysiology, Sleep Medicine, Pain Medicine and Neuroimaging  [facility] [address] [place], [place] [address] [phone]

## 2018-05-16 ENCOUNTER — Telehealth: Payer: Self-pay | Admitting: Neurology

## 2018-05-16 NOTE — Telephone Encounter (Signed)
I called and LVM returning pt call.  Needing to get more information from him.

## 2018-05-16 NOTE — Telephone Encounter (Signed)
I called pt. Advised he should proceed to ED to be evaluated for sx. Once he is seen, asked him to f/u back up with [place]. He verbalized understanding.

## 2018-05-16 NOTE — Telephone Encounter (Addendum)
Patient finished taking Cladribine, 9 Tabs, (MAVENCLAD, 9 TABS,) 10 MG TBPK on 05-06-18. On 05-13-18 he developed a rash which is now all over his body. I advised Dr. Felecia Shelling is out of the office and will send message to his nurse.

## 2018-05-16 NOTE — Telephone Encounter (Addendum)
I called pt back. Rash is getting worse, having burning pain with it. Rash all over body. He has hydroxyzine prn. Only takes prn for itching.  He got a flu shot and shingles vaccine two weeks prior to [facility]. He has had loose stools since starting [place].    Has had a headache for the last 5 days. OTC meds ineffective. Has tried aleve, ibuprofen, ASA. Unable to eat. Having to go to the bathroom at least 6x per day since taking medication. He has not started any new medications. Denies changing detergents, soaps.  He has Triamcinolone cream left over from past rash. Has put this on today. Waiting to see if effective. Only has a little left.  He does not want to continue with treatment. Advised I will speak with WID and call back to advise. He verbalized understanding.

## 2018-05-17 NOTE — Telephone Encounter (Signed)
Will follow up after ED visit. Cc Dr Felecia Shelling, MD

## 2018-05-17 NOTE — Telephone Encounter (Signed)
Agree with your assessment.

## 2018-05-17 NOTE — Telephone Encounter (Signed)
Dr. Brett Fairy- I sent this to you yesterday. Can you please address? Thank you

## 2018-05-19 ENCOUNTER — Telehealth: Payer: Self-pay | Admitting: Neurology

## 2018-05-19 MED ORDER — PREDNISONE 20 MG PO TABS: ORAL_TABLET | ORAL | 0 refills | Status: DC

## 2018-05-19 NOTE — Telephone Encounter (Signed)
The patient called back today, apparently Dr. Felecia Shelling was trying to get in touch with him.  He had an allergic response to [facility].  He still has a rash but his headache is better.  He does not have any other significant issues such as respiratory problems.  He would like to talk to Dr. Felecia Shelling if possible.

## 2018-05-19 NOTE — Telephone Encounter (Signed)
I called twice and got voicemail and left messages.  I was calling to find out how he was doing after his probable allergic reaction.  I will try to reach him again one more time later today.  I reach Mr. Wilmer at 4:15 PM.  His last day of Mavenclad was 05/06/2018.  On 05/13/2018 he began to experience rash, headache, diarrhea and much more fatigue.  Hydroxyzine and triamcinolone helped the itching a little bit.  Today, he notes that the rash is improved in the shoulder region but unchanged elsewhere (trunk, thighs)  I will call in a 20 mg steroid pack (taper from 6 pills to 1 pill over 6 days) and see him on Monday.  He should call if he has any significant worsening.  I will also try to find out from the clinical studies when the rashes occurred to help determine if this is indeed a hypersensitivity reaction.

## 2018-05-22 ENCOUNTER — Encounter: Payer: Self-pay | Admitting: Neurology

## 2018-05-22 ENCOUNTER — Ambulatory Visit: Payer: Medicare Other | Admitting: Neurology

## 2018-05-22 VITALS — BP 147/85 | HR 80 | Ht 73.0 in | Wt 204.0 lb

## 2018-05-22 DIAGNOSIS — Z79899 Other long term (current) drug therapy: Secondary | ICD-10-CM | POA: Diagnosis not present

## 2018-05-22 DIAGNOSIS — G35 Multiple sclerosis: Secondary | ICD-10-CM

## 2018-05-22 DIAGNOSIS — R21 Rash and other nonspecific skin eruption: Secondary | ICD-10-CM | POA: Diagnosis not present

## 2018-05-22 DIAGNOSIS — R269 Unspecified abnormalities of gait and mobility: Secondary | ICD-10-CM

## 2018-05-22 MED ORDER — HYDROCODONE-ACETAMINOPHEN 5-325 MG PO TABS: 1.0000 | ORAL_TABLET | Freq: Four times a day (QID) | ORAL | 0 refills | Status: AC | PRN

## 2018-05-22 MED ORDER — TRIAMCINOLONE ACETONIDE 0.5 % EX CREA: 1.0000 "application " | TOPICAL_CREAM | Freq: Three times a day (TID) | CUTANEOUS | 1 refills | Status: DC

## 2018-05-22 NOTE — Progress Notes (Signed)
[facility]  PATIENT: Don Hamilton DOB: 1960/09/02  REFERRING DOCTOR OR PCP:  None SOURCE: patient, records from [facility].  _________________________________   HISTORICAL  CHIEF COMPLAINT:  Chief Complaint  Patient presents with  . Follow-up    RM 12, alone. Last seen 05/02/18. Wants to discuss treatment. C/o scratcy throat and has lost his voice a little in last month since getting Flu and shingles vaccine.     HISTORY Don Hamilton is a 57 year old man with multiple sclerosis.  Update 05/22/2018: He had his 5 days of Mavenclad between 05/02/2018 and 05/06/2018.  On 05/13/2018, he began to experience a rash, headache, diarrhea and felt much more fatigued.  He had some hydroxyzine and triamcinolone ointment and took those with some benefit -experiencing less itching.  He called our office a couple days later and I spoke to him 05/19/2018.  At that time, he noted that the rash was improved in the shoulder region but was about the same in the trunk and the thighs.   I sent in a prescription for a 6-day steroid taper from 120 mg down to 20 mg.  He is halfway through.    Currently, he is experiencing a severe headache across the forehead.     He has a mild rash in the inguinal region and adjacent abdomen and thighs and a little in the armpit.   This is improved compared to the initial rash.      He had the shingles and flu vaccines about  6 weeks and noted his voice became more raspy and he had coughing.       Currently, the rash is better.   This morning, he had more rash but it is better now than earlier today.     He was diagnosed with MS in 2008/2009 but was told he likely had it a while given the chronic appearance.  He had rashes and welts on Avonex and Betaseron.  He had diarrhea (severe) on Aubagio.   Tecfidera caused a rash.    He has an atrial tachycardia and has had ablations so I've been reluctant to have him do Gilenya/Mayzent.     Update  05/02/2018: He will be starting Mavenclad and he finally got his medication.     He will be followed in the [facility] study ([facility]).   His last medication was Tecfidera but he had rash.      Gait is doing the same ans he uses a cane.   Some stumbles but no falls.   His right leg is weaker, stiffer and more painful than the left.    Some numbness in the right leg and both ankles.      Update 03/22/2018: He tried Tecfidera again but had difficulties with possible allergic reaction with hives and burning in the skin so he stopped after a few months (last dose June 2019).  Before that, he was on alemtuzumab but had severe infusion reactions with hives for several weeks.  He also had infusion reactions on Tysabri.  Earlier, he was on Betaseron, Aubagio but had difficulty tolerating them.   Due to some EKG changes, Gilenya was considered but not tried..   We had a conversation about some of the treatment options including Mavenclad and Mayzent.  He feels his gait is doing about the same as earlier this year.  He is unstable and uses a cane.  He notes some weakness in his legs and mild spasticity.  He notes a lot of fatigue  and insomnia is also doing worse.  He gets some benefit from Adderall for the fatigue and needs a new refill.  He felt he did better with the immediate release Adderall than the time release.  Doxepin helps the insomnia some.  Oxybutynin has helped his urinary frequency.   Update 09/01/2017:   He started the Tecfidera but stopped  a couple months ago because he was having side effects including flushing and rash/hives.  The rash would last the whole day.   Of note, he did have one course of treatment with alemtuzumab in 2011 (off label at that time) but did not have the second years infusion due to infusion reactions.     Before alemtuzumab, he was on Tysabri but had severe infusion reactions and needed to stop.   He also had been at various times on Betaseron, Aubagio, Gilenya.    He is  having difficulty with his gait and relies on a cane constantly now. Going up and down stairs is difficult. He stumbles and has occasional falls. He has spasticity mostly in the legs. Baclofen gives him some benefit. He continues to have some difficulty with bladder frequency and urgency. This is helped a little bit with the oxybutynin.   Vision is doing about the same (mild reduced on left). The patient is doing about the same as last visit.  He feels his sleep and fatigue are both doing worse. Insomnia has been a chronic issue. He does sleep a little bit better when he takes gabapentin, Flexeril and baclofen at bedtime.    He tried Nuvigil for fatigue but there was no benefit.     Depression and anxiety are both worsening. Years ago he was on Lexapro with some benefit.  He does have basal cell carcinoma (nose) and had surgery.   From 08/04/2016: MS:   He remains off any DMT.   He was on Alemtuzumab about 2011 but had side effects and never had the second year.    Gait/strength/sensation/Pain: He has had difficulty with his gait since 2000. About 10 years ago he started to use a cane.  He continues to use a cane.   Notes transient numbness in the arms and legs.   The numbness and tingling is fairly random many does not identify a trigger.   He ran out of baclofen and felt that it did help his spasticity.      Pain:  He notes pain in the right greater than left leg associated with spasticity. Hip area is most painful.   This has been evaluated and an orthopedic reason has not been identified.    He also notes neck and shoulder pain.    He currently is taking ibuprofen 800 mg a few times a day.   He takes Aleve.   He had a UDS that was positive for cocaine in 2016.  He denies any use of it.  We discussed that for the time being I can't write any controlled substances for him.     Bladder:   His bladder function is doing weld he tolerates it welll on oxybutynin . There is just minimal frequency and he  has nocturia x 1.  Vision: He notes mildly reduced visual acuity out of both eyes. He has altered color vision out of the left eye.  Fatigue/sleep:   He reports some cognitive and physical fatigue. He had mild benefit for the fatigue with the Adderall but it was not well tolerated. Nuvigil was tried in the  past but was not helpful. He reports difficulty falling and staying asleep.  He did not take the doxepin.   He feels unrefreshed in the morning.   A PSG did not show OSA.   Mood/cognition: He notes mild depression and some anxiety.  Anti depressants had not helped much so he stopped.   He was working as an Art gallery manager in the past.   Cognitive issues are his main concernHe has difficulty with cognition, mostly problems with reduced attention, short-term memory and executive function.    MS History:   He presented in 1998 with left optic neuritis. An MRI of the brain was consistent with multiple sclerosis. He was started on Betaseron but he stopped due to allergic reactions and injection site reactions. Avonex was tried but since he stopped after a while due to rash. When Tysabri was reintroduced around 2006, he went on that for about a year or so. However, he would get a rash with each infusion. Therefore, it was discontinued and he was placed on off label Campath. He received his first year dose but had severe infusion reactions with rash that lasted over a month. Therefore, sucking year Campath was not performed. In 2015, he was placed on Aubagio. After month it was stopped due to diarrhea he has not been on Tecfidera. Gilenya was considered but he does have a cardiac arrhythmia. Last year, Dr. Alton Revere placed him on Copaxone but he decided not to take it as it is generally weaker than other medications he has been on.         REVIEW OF SYSTEMS: Constitutional: No fevers, chills, sweats, or change in appetite.   He has fatigue and insomnia Eyes: No visual changes, double vision, eye pain Ear, nose and  throat: No hearing loss, ear pain, nasal congestion, sore throat Cardiovascular: No chest pain, palpitations Respiratory: No shortness of breath at rest or with exertion.   No wheezes GastrointestinaI: No nausea, vomiting, diarrhea, abdominal pain, fecal incontinence Genitourinary:He has urinary frequency.. Musculoskeletal: No neck pain.  Notes right hip > back pain Integumentary: No rash, pruritus, skin lesions Neurological: as above Psychiatric: Currently denies depression or anxiety. Endocrine: No palpitations, diaphoresis, change in appetite, change in weigh or increased thirst Hematologic/Lymphatic: No anemia, purpura, petechiae. Allergic/Immunologic: No itchy/runny eyes, nasal congestion, recent allergic reactions, rashes  ALLERGIES: Allergies  Allergen Reactions  . Campath [Alemtuzumab] Hives  . Avonex [Interferon Beta-1a] Rash  . Betaseron [Interferon Beta-1b] Rash  . Tysabri [Natalizumab] Rash    HOME MEDICATIONS:  Current Outpatient Medications:  .  amphetamine-dextroamphetamine (ADDERALL) 10 MG tablet, Take 1 tablet (10 mg total) by mouth 2 (two) times daily with a meal., Disp: 60 tablet, Rfl: 0 .  baclofen (LIORESAL) 10 MG tablet, Take 1 tablet (10 mg total) by mouth 4 (four) times daily., Disp: 120 each, Rfl: 11 .  Cladribine, 9 Tabs, (MAVENCLAD, 9 TABS,) 10 MG TBPK, Take 2 mg by mouth., Disp: , Rfl:  .  cyclobenzaprine (FLEXERIL) 5 MG tablet, One to two at bedtime, Disp: 60 tablet, Rfl: 11 .  doxepin (SINEQUAN) 10 MG capsule, Take 1 capsule (10 mg total) by mouth at bedtime., Disp: 30 capsule, Rfl: 11 .  escitalopram (LEXAPRO) 20 MG tablet, Take 1 tablet (20 mg total) by mouth daily., Disp: 90 tablet, Rfl: 1 .  gabapentin (NEURONTIN) 300 MG capsule, Take 1 capsule (300 mg total) by mouth 3 (three) times daily., Disp: 270 capsule, Rfl: 3 .  hydrOXYzine (ATARAX/VISTARIL) 10 MG tablet, Take 1 tablet (  10 mg total) by mouth 3 (three) times daily as needed., Disp: 90  tablet, Rfl: 5 .  meclizine (ANTIVERT) 25 MG tablet, Take 25 mg by mouth 2 (two) times daily as needed for dizziness., Disp: , Rfl:  .  oxybutynin (DITROPAN) 5 MG tablet, Take 1 tablet (5 mg total) by mouth 2 (two) times daily., Disp: 60 tablet, Rfl: 11 .  predniSONE (DELTASONE) 20 MG tablet, Take 6 pills po the first day and taper down to one pill over 6 days, Disp: 21 tablet, Rfl: 0 .  HYDROcodone-acetaminophen (NORCO/VICODIN) 5-325 MG tablet, Take 1 tablet by mouth every 6 (six) hours as needed for up to 7 days for moderate pain., Disp: 28 tablet, Rfl: 0 .  triamcinolone cream (KENALOG) 0.5 %, Apply 1 application topically 3 (three) times daily., Disp: 30 g, Rfl: 1  PAST MEDICAL HISTORY: Past Medical History:  Diagnosis Date  . Atrial tachycardia (HCC)   . Basal cell carcinoma of face   . Cancer (HCC)   . Headache   . Multiple sclerosis (HCC)   . Vision abnormalities     PAST SURGICAL HISTORY: Past Surgical History:  Procedure Laterality Date  . CARDIAC ELECTROPHYSIOLOGY MAPPING AND ABLATION    . SKIN CANCER EXCISION  2016   nose  . TONSILLECTOMY      FAMILY HISTORY: Family History  Problem Relation Age of Onset  . Healthy Mother   . Stroke Father   . Heart attack Father   . Leukemia Father     SOCIAL HISTORY:  Social History   Socioeconomic History  . Marital status: Married    Spouse name: Not on file  . Number of children: Not on file  . Years of education: Not on file  . Highest education level: Not on file  Occupational History  . Not on file  Social Needs  . Financial resource strain: Not on file  . Food insecurity:    Worry: Not on file    Inability: Not on file  . Transportation needs:    Medical: Not on file    Non-medical: Not on file  Tobacco Use  . Smoking status: Current Every Day Smoker  . Smokeless tobacco: Never Used  Substance and Sexual Activity  . Alcohol use: Yes    Alcohol/week: 0.0 standard drinks    Comment: occasional beer  .  Drug use: No  . Sexual activity: Not on file  Lifestyle  . Physical activity:    Days per week: Not on file    Minutes per session: Not on file  . Stress: Not on file  Relationships  . Social connections:    Talks on phone: Not on file    Gets together: Not on file    Attends religious service: Not on file    Active member of club or organization: Not on file    Attends meetings of clubs or organizations: Not on file    Relationship status: Not on file  . Intimate partner violence:    Fear of current or ex partner: Not on file    Emotionally abused: Not on file    Physically abused: Not on file    Forced sexual activity: Not on file  Other Topics Concern  . Not on file  Social History Narrative  . Not on file     PHYSICAL EXAM  Vitals:   05/22/18 1534  BP: (!) 147/85  Pulse: 80  Weight: 204 lb (92.5 kg)  Height: 6\' 1"  (1.854  m)    Body mass index is 26.91 kg/m.   General: The patient is well-developed and well-nourished and in no acute distress.   Extremities with macular papular rash in groin, adjacent abdomen/thighs and underarm.  Lymph nodes are nonpalpable.        Neurologic Exam  Mental status: The patient is alert and oriented x 3 at the time of the examination. The patient has apparent normal recent and remote memory, with reuced attention span and concentration ability.   Speech is normal.  Cranial nerves: Extraocular movements are full.     Facial symmetry is present. Facial strength and sensation is normal. Trapezius strength is strong..  The tongue is midline, and the patient has symmetric elevation of the soft palate. No obvious hearing deficits are noted.  Motor:  Muscle bulk is normal.  Muscle tone is increased in the legs. Strength is 4/5 in the right hip flexors and right toe and ankle extensors..   Sensory: Sensory testing reduced touch anefd vibration in left arm and leg.    Coordination: Finger-nose-finger is performed well. Heel-to-shin is  performed poorly on both legs.   Gait and station: Station is normal.  Gait is mildly wide.  The tandem gait is wide.  Romberg is negative.       Reflexes: Deep tendon reflexes are symmetric and increased in legs , R>L. There was spread at the knees.  No ankle clonus plantar responses are flexor.      DIAGNOSTIC DATA (LABS, IMAGING, TESTING) - I reviewed patient records, labs, notes, testing and imaging myself where available.      ASSESSMENT AND PLAN  Multiple sclerosis (HCC) - Plan: CBC with Differential/Platelet, Comprehensive metabolic panel  High risk medication use - Plan: CBC with Differential/Platelet, Comprehensive metabolic panel  Rash  Gait disturbance   1.   He likely had a hypersensitivity reaction to [facility].   We discussed options in regards to therapy.    He has had difficulty with allergic reaction or other s.e. To most of the DMT's 2.   Continue steroid taper.   Renew triamcinpolone.   Take hydroxyzine prn.    Hydrocodone for HA.   3.   He will continue oxybutynin for his bladder issues.  4.   Due to the combination of his MS related physical and cognitive impairments and fatigue, Don Hamilton is unable to work.    Return to see me in 2 months or sooner if there are new or worsening neurologic symptoms.    Don Ruppe A. Epimenio Foot, MD, PhD 05/22/2018, 6:56 PM Certified in Neurology, Clinical Neurophysiology, Sleep Medicine, Pain Medicine and Neuroimaging  [facility] [address], [address] [place], [place] [address] [phone]

## 2018-05-23 LAB — COMPREHENSIVE METABOLIC PANEL
A/G RATIO: 2.1 (ref 1.2–2.2)
ALT: 25 IU/L (ref 0–44)
AST: 18 IU/L (ref 0–40)
Albumin: 4.5 g/dL (ref 3.5–5.5)
Alkaline Phosphatase: 79 IU/L (ref 39–117)
BILIRUBIN TOTAL: 0.6 mg/dL (ref 0.0–1.2)
BUN/Creatinine Ratio: 14 (ref 9–20)
BUN: 13 mg/dL (ref 6–24)
CHLORIDE: 99 mmol/L (ref 96–106)
CO2: 19 mmol/L — ABNORMAL LOW (ref 20–29)
Calcium: 8.8 mg/dL (ref 8.7–10.2)
Creatinine, Ser: 0.93 mg/dL (ref 0.76–1.27)
GFR calc non Af Amer: 91 mL/min/{1.73_m2} (ref >59)
GFR, EST AFRICAN AMERICAN: 105 mL/min/{1.73_m2} (ref >59)
Globulin, Total: 2.1 g/dL (ref 1.5–4.5)
Glucose: 118 mg/dL — ABNORMAL HIGH (ref 65–99)
POTASSIUM: 4 mmol/L (ref 3.5–5.2)
Sodium: 135 mmol/L (ref 134–144)
TOTAL PROTEIN: 6.6 g/dL (ref 6.0–8.5)

## 2018-05-23 LAB — CBC WITH DIFFERENTIAL/PLATELET
BASOS ABS: 0 10*3/uL (ref 0.0–0.2)
BASOS: 0 %
EOS (ABSOLUTE): 0.2 10*3/uL (ref 0.0–0.4)
Eos: 2 %
HEMATOCRIT: 35.3 % — AB (ref 37.5–51.0)
HEMOGLOBIN: 13.1 g/dL (ref 13.0–17.7)
Immature Grans (Abs): 0 10*3/uL (ref 0.0–0.1)
Immature Granulocytes: 1 %
LYMPHS ABS: 0.2 10*3/uL — AB (ref 0.7–3.1)
Lymphs: 2 %
MCH: 34.4 pg — ABNORMAL HIGH (ref 26.6–33.0)
MCHC: 37.1 g/dL — ABNORMAL HIGH (ref 31.5–35.7)
MCV: 93 fL (ref 79–97)
MONOCYTES: 3 %
Monocytes Absolute: 0.2 10*3/uL (ref 0.1–0.9)
NEUTROS ABS: 7.4 10*3/uL — AB (ref 1.4–7.0)
Neutrophils: 92 %
Platelets: 153 10*3/uL (ref 150–450)
RBC: 3.81 x10E6/uL — ABNORMAL LOW (ref 4.14–5.80)
RDW: 13.6 % (ref 12.3–15.4)
WBC: 8 10*3/uL (ref 3.4–10.8)

## 2018-05-25 ENCOUNTER — Telehealth: Payer: Self-pay | Admitting: Neurology

## 2018-05-25 DIAGNOSIS — G35 Multiple sclerosis: Secondary | ICD-10-CM

## 2018-05-25 MED ORDER — VALACYCLOVIR HCL 500 MG PO TABS: 500.0000 mg | ORAL_TABLET | Freq: Two times a day (BID) | ORAL | 3 refills | Status: DC

## 2018-05-25 NOTE — Telephone Encounter (Signed)
I spoke to Mr. Don Hamilton.    The lymphocyte count was 0.2 so I will be starting Valtrex and we will recheck next month.      Due to his rash we will not give him additional Mavenclad.  We might still keep him in the study if allowed to get continued data  We also discussed that he clearly had an immune system response to the [facility] and we can probably wait a significant period of time before starting another disease modifying therapy.  We will discuss more at the next visit.

## 2018-05-29 ENCOUNTER — Emergency Department (HOSPITAL_BASED_OUTPATIENT_CLINIC_OR_DEPARTMENT_OTHER)
Admission: EM | Admit: 2018-05-29 | Discharge: 2018-05-29 | Disposition: A | Discharge status: Home / Self Care | Payer: Medicare Other | Attending: Emergency Medicine | Admitting: Emergency Medicine

## 2018-05-29 ENCOUNTER — Encounter (HOSPITAL_BASED_OUTPATIENT_CLINIC_OR_DEPARTMENT_OTHER): Payer: Self-pay | Admitting: *Deleted

## 2018-05-29 ENCOUNTER — Other Ambulatory Visit: Payer: Self-pay

## 2018-05-29 ENCOUNTER — Emergency Department (HOSPITAL_BASED_OUTPATIENT_CLINIC_OR_DEPARTMENT_OTHER): Payer: Medicare Other

## 2018-05-29 DIAGNOSIS — R05 Cough: Secondary | ICD-10-CM | POA: Insufficient documentation

## 2018-05-29 DIAGNOSIS — F17210 Nicotine dependence, cigarettes, uncomplicated: Secondary | ICD-10-CM | POA: Diagnosis not present

## 2018-05-29 DIAGNOSIS — R059 Cough, unspecified: Secondary | ICD-10-CM

## 2018-05-29 DIAGNOSIS — Z85828 Personal history of other malignant neoplasm of skin: Secondary | ICD-10-CM | POA: Insufficient documentation

## 2018-05-29 LAB — COMPREHENSIVE METABOLIC PANEL
ALBUMIN: 4 g/dL (ref 3.5–5.0)
ALT: 35 U/L (ref 0–44)
AST: 30 U/L (ref 15–41)
Alkaline Phosphatase: 71 U/L (ref 38–126)
Anion gap: 7 (ref 5–15)
BILIRUBIN TOTAL: 1 mg/dL (ref 0.3–1.2)
BUN: 9 mg/dL (ref 6–20)
CHLORIDE: 105 mmol/L (ref 98–111)
CO2: 25 mmol/L (ref 22–32)
Calcium: 8.6 mg/dL — ABNORMAL LOW (ref 8.9–10.3)
Creatinine, Ser: 0.76 mg/dL (ref 0.61–1.24)
GFR calc Af Amer: >60 mL/min (ref >60)
GFR calc non Af Amer: >60 mL/min (ref >60)
GLUCOSE: 94 mg/dL (ref 70–99)
POTASSIUM: 3.4 mmol/L — AB (ref 3.5–5.1)
Sodium: 137 mmol/L (ref 135–145)
TOTAL PROTEIN: 7.1 g/dL (ref 6.5–8.1)

## 2018-05-29 LAB — CBC
HEMATOCRIT: 38.9 % — AB (ref 39.0–52.0)
Hemoglobin: 13.5 g/dL (ref 13.0–17.0)
MCH: 34 pg (ref 26.0–34.0)
MCHC: 34.7 g/dL (ref 30.0–36.0)
MCV: 98 fL (ref 80.0–100.0)
NRBC: 0 % (ref 0.0–0.2)
Platelets: 134 10*3/uL — ABNORMAL LOW (ref 150–400)
RBC: 3.97 MIL/uL — ABNORMAL LOW (ref 4.22–5.81)
RDW: 14.4 % (ref 11.5–15.5)
WBC: 5.8 10*3/uL (ref 4.0–10.5)

## 2018-05-29 LAB — I-STAT CG4 LACTIC ACID, ED: Lactic Acid, Venous: 1.29 mmol/L (ref 0.5–1.9)

## 2018-05-29 LAB — INFLUENZA PANEL BY PCR (TYPE A & B)
Influenza A By PCR: NEGATIVE
Influenza B By PCR: NEGATIVE

## 2018-05-29 MED ORDER — ONDANSETRON HCL 4 MG/2ML IJ SOLN
4.0000 mg | Freq: Once | INTRAMUSCULAR | Status: AC
  Administered 2018-05-29: 4 mg via INTRAVENOUS
  Filled 2018-05-29: qty 2

## 2018-05-29 MED ORDER — KETOROLAC TROMETHAMINE 15 MG/ML IJ SOLN
15.0000 mg | Freq: Once | INTRAMUSCULAR | Status: AC
  Administered 2018-05-29: 15 mg via INTRAVENOUS
  Filled 2018-05-29: qty 1

## 2018-05-29 MED ORDER — SODIUM CHLORIDE 0.9 % IV BOLUS
1000.0000 mL | Freq: Once | INTRAVENOUS | Status: AC
  Administered 2018-05-29: 1000 mL via INTRAVENOUS

## 2018-05-29 NOTE — ED Triage Notes (Signed)
Pt c/o flu like symptoms x 3 days  Including n/v/d , h/a , fever sent here by PMD

## 2018-05-29 NOTE — ED Notes (Signed)
Pt left without speaking to a MD in regards to his discharge.  he stated that he needed to go feed his dog and eat his [employer] pizza, he stated that if I didn't take his IV out, he would do it himself. He also stated that his anxiety would not let him stay any longer.

## 2018-05-29 NOTE — Telephone Encounter (Signed)
Dr. Felecia Shelling- FYI I called patient back. He will proceed to ED. He is going to go to [facility], [place], [place] [address]. He will call back if he has further questions/concerns.

## 2018-05-29 NOTE — Telephone Encounter (Addendum)
I called pt. This past Thursday he started to get sick.  He started valtrex this day after speaking with Dr. Felecia Shelling. He is having Flu-like sx. He got glu shot about a month ago. He has fever, chills, headache. Has a lot of congestion. Thick, green, yellow mucus. He is coughing all this up. Achy all over. Rash is coming back starting. He is drinking a lot of fluids. Advised I will speak w/ Dr. Felecia Shelling and call him back to advise. He verbalized understanding.

## 2018-05-29 NOTE — Telephone Encounter (Signed)
Patient called and requested to speak with someone soon. Patient states that he has rabies in addition to other problems.He was very upset and stated that he did not know what he is able to take. He is experiencing flu like symptoms, headaches, fever, chills, weakness, loss of appetite, coughing, sneezing. He states that it is a bad reaction from his MS treatment. He wants to know if he is going to have a heart attack. He is very concerned. Please call and advise. [phone].

## 2018-05-29 NOTE — ED Provider Notes (Signed)
[facility] Emergency Department Provider Note MRN:  [id number]  Arrival date & time: 05/30/18     Chief Complaint   Influenza   History of Present Illness   Don Hamilton is a 57 y.o. year-old male with a history of multiple sclerosis presenting to the ED with chief complaint of influenza.  Patient explains that he has difficult to control multiple sclerosis, has tried multiple different medications this year, all of which resulting in some type of side effect or allergic reaction.  Was started on a new medicine at the beginning of the month, which resulted in rash requiring prednisone prescription from his neurologist.  4 days ago, patient began feeling ill.  Nausea, vomiting, diarrhea, cough, general malaise, dull frontal headache, productive cough, dyspnea on exertion.  Denies chest pain, no abdominal pain.  Sent here from PCP office for evaluation for infection, possibly influenza.  Review of Systems  A complete 10 system review of systems was obtained and all systems are negative except as noted in the HPI and PMH.   Patient's Health History    Past Medical History:  Diagnosis Date  . Atrial tachycardia ([place])   . Basal cell carcinoma of face   . Cancer ([place])   . Headache   . Multiple sclerosis ([place])   . Vision abnormalities     Past Surgical History:  Procedure Laterality Date  . CARDIAC ELECTROPHYSIOLOGY MAPPING AND ABLATION    . SKIN CANCER EXCISION  2016   nose  . TONSILLECTOMY      Family History  Problem Relation Age of Onset  . Healthy Mother   . Stroke Father   . Heart attack Father   . Leukemia Father     Social History   Socioeconomic History  . Marital status: Married    Spouse name: Not on file  . Number of children: Not on file  . Years of education: Not on file  . Highest education level: Not on file  Occupational History  . Not on file  Social Needs  . Financial resource strain: Not on file  . Food insecurity:   Worry: Not on file    Inability: Not on file  . Transportation needs:    Medical: Not on file    Non-medical: Not on file  Tobacco Use  . Smoking status: Current Every Day Smoker    Packs/day: 1.00  . Smokeless tobacco: Never Used  Substance and Sexual Activity  . Alcohol use: Yes    Alcohol/week: 0.0 standard drinks    Comment: occasional beer  . Drug use: No  . Sexual activity: Not on file  Lifestyle  . Physical activity:    Days per week: Not on file    Minutes per session: Not on file  . Stress: Not on file  Relationships  . Social connections:    Talks on phone: Not on file    Gets together: Not on file    Attends religious service: Not on file    Active member of club or organization: Not on file    Attends meetings of clubs or organizations: Not on file    Relationship status: Not on file  . Intimate partner violence:    Fear of current or ex partner: Not on file    Emotionally abused: Not on file    Physically abused: Not on file    Forced sexual activity: Not on file  Other Topics Concern  . Not on file  Social  History Narrative  . Not on file     Physical Exam  Vital Signs and Nursing Notes reviewed Vitals:   05/29/18 1543 05/29/18 1815  BP: (!) 175/84 (!) 165/81  Pulse:  60  Resp:  18  Temp:    SpO2:  100%    CONSTITUTIONAL: Well-appearing, NAD NEURO:  Alert and oriented x 3, no focal deficits EYES:  eyes equal and reactive ENT/NECK:  no LAD, no JVD CARDIO: Regular rate, well-perfused, normal S1 and S2 PULM:  CTAB no wheezing or rhonchi GI/GU:  normal bowel sounds, non-distended, non-tender MSK/SPINE:  No gross deformities, no edema SKIN:  no rash, atraumatic PSYCH:  Appropriate speech and behavior  Diagnostic and Interventional Summary    EKG Interpretation  Date/Time:    Ventricular Rate:    PR Interval:    QRS Duration:   QT Interval:    QTC Calculation:   R Axis:     Text Interpretation:        Labs Reviewed  CBC - Abnormal;  Notable for the following components:      Result Value   RBC 3.97 (*)    HCT 38.9 (*)    Platelets 134 (*)    All other components within normal limits  COMPREHENSIVE METABOLIC PANEL - Abnormal; Notable for the following components:   Potassium 3.4 (*)    Calcium 8.6 (*)    All other components within normal limits  INFLUENZA PANEL BY PCR (TYPE A & B)  I-STAT CG4 LACTIC ACID, ED    DG Chest 2 View  Final Result      Medications  sodium chloride 0.9 % bolus 1,000 mL (0 mLs Intravenous Stopped 05/29/18 1753)  ketorolac (TORADOL) 15 MG/ML injection 15 mg (15 mg Intravenous Given 05/29/18 1625)  ondansetron (ZOFRAN) injection 4 mg (4 mg Intravenous Given 05/29/18 1625)     Procedures Critical Care  ED Course and Medical Decision Making  I have reviewed the triage vital signs and the nursing notes.  Pertinent labs & imaging results that were available during my care of the patient were reviewed by me and considered in my medical decision making (see below for details).  Considering influenza versus pneumonia versus UTI in this 57 year old male with history of MS.  Patient reports that his lymphocyte count has been very low recently due to side effect of MS medication.  Will screen with labs, chest x-ray, urinalysis, attempt symptomatic relief, swab for flu.  Patient left the ED without a completed assessment or evaluation.  Barth Kirks. [place] Small, [place] [email or web address]  Final Clinical Impressions(s) / ED Diagnoses     ICD-10-CM   1. Cough R05 DG Chest 2 View    DG Chest 2 View    ED Discharge Orders    None         Maudie Flakes, MD 05/30/18 [id number]

## 2018-05-29 NOTE — Telephone Encounter (Signed)
Per Dr. Felecia Shelling- pt should proceed to ED to have labwork and CXR done.

## 2018-06-22 ENCOUNTER — Ambulatory Visit (INDEPENDENT_AMBULATORY_CARE_PROVIDER_SITE_OTHER): Payer: Medicare Other | Admitting: Family Medicine

## 2018-06-22 ENCOUNTER — Encounter: Payer: Self-pay | Admitting: Family Medicine

## 2018-06-22 VITALS — BP 130/80 | HR 89 | Temp 98.3°F | Ht 73.0 in | Wt 201.1 lb

## 2018-06-22 DIAGNOSIS — Z23 Encounter for immunization: Secondary | ICD-10-CM

## 2018-06-22 DIAGNOSIS — E876 Hypokalemia: Secondary | ICD-10-CM

## 2018-06-22 DIAGNOSIS — Z720 Tobacco use: Secondary | ICD-10-CM | POA: Diagnosis not present

## 2018-06-22 DIAGNOSIS — Z1211 Encounter for screening for malignant neoplasm of colon: Secondary | ICD-10-CM

## 2018-06-22 DIAGNOSIS — J0100 Acute maxillary sinusitis, unspecified: Secondary | ICD-10-CM | POA: Diagnosis not present

## 2018-06-22 DIAGNOSIS — G35 Multiple sclerosis: Secondary | ICD-10-CM

## 2018-06-22 MED ORDER — DOXYCYCLINE HYCLATE 100 MG PO TABS: 100.0000 mg | ORAL_TABLET | Freq: Two times a day (BID) | ORAL | 0 refills | Status: DC

## 2018-06-22 NOTE — Progress Notes (Signed)
Pre visit review using our clinic review tool, if applicable. No additional management support is needed unless otherwise documented below in the visit note. 

## 2018-06-22 NOTE — Progress Notes (Signed)
Chief Complaint  Patient presents with  . New Patient (Initial Visit)       New Patient Visit SUBJECTIVE: HPI: Don Hamilton is an 57 y.o.male who is being seen for establishing care.  The patient was previously seen at [facility].  2 mo of cough, sinus pressure/pain, nasal congestion, wheezing, sob. Had chemo around 2 mo ago and it depleted him immune system. Went to ED, showed some findings suggestive of COPD, no infiltrate or fluid. Labs showed hypokalemia. Neuro has been working w pt regarding his MS. Requesting reck for CBC here rather than neurology office.   Smokes 10-12 cigarettes daily. Failed patches, Chantix.    Allergies  Allergen Reactions  . Campath [Alemtuzumab] Hives  . Avonex [Interferon Beta-1a] Rash  . Betaseron [Interferon Beta-1b] Rash  . Tysabri [Natalizumab] Rash    Past Medical History:  Diagnosis Date  . Atrial tachycardia ([place])   . Basal cell carcinoma of face   . Cancer ([place])   . Headache   . Multiple sclerosis ([place])   . Vision abnormalities    Past Surgical History:  Procedure Laterality Date  . CARDIAC ELECTROPHYSIOLOGY MAPPING AND ABLATION    . SKIN CANCER EXCISION  2016   nose  . TONSILLECTOMY     Family History  Problem Relation Age of Onset  . Healthy Mother   . Stroke Father   . Heart attack Father   . Leukemia Father    Allergies  Allergen Reactions  . Campath [Alemtuzumab] Hives  . Avonex [Interferon Beta-1a] Rash  . Betaseron [Interferon Beta-1b] Rash  . Tysabri [Natalizumab] Rash    Current Outpatient Medications:  .  amphetamine-dextroamphetamine (ADDERALL) 10 MG tablet, Take 1 tablet (10 mg total) by mouth 2 (two) times daily with a meal., Disp: 60 tablet, Rfl: 0 .  baclofen (LIORESAL) 10 MG tablet, Take 1 tablet (10 mg total) by mouth 4 (four) times daily., Disp: 120 each, Rfl: 11 .  Cladribine, 9 Tabs, (MAVENCLAD, 9 TABS,) 10 MG TBPK, Take 2 mg by mouth., Disp: , Rfl:  .  cyclobenzaprine (FLEXERIL) 5 MG tablet, One  to two at bedtime, Disp: 60 tablet, Rfl: 11 .  doxepin (SINEQUAN) 10 MG capsule, Take 1 capsule (10 mg total) by mouth at bedtime., Disp: 30 capsule, Rfl: 11 .  doxycycline (VIBRA-TABS) 100 MG tablet, Take 1 tablet (100 mg total) by mouth 2 (two) times daily., Disp: 20 tablet, Rfl: 0 .  escitalopram (LEXAPRO) 20 MG tablet, Take 1 tablet (20 mg total) by mouth daily., Disp: 90 tablet, Rfl: 1 .  gabapentin (NEURONTIN) 300 MG capsule, Take 1 capsule (300 mg total) by mouth 3 (three) times daily., Disp: 270 capsule, Rfl: 3 .  hydrOXYzine (ATARAX/VISTARIL) 10 MG tablet, Take 1 tablet (10 mg total) by mouth 3 (three) times daily as needed., Disp: 90 tablet, Rfl: 5 .  meclizine (ANTIVERT) 25 MG tablet, Take 25 mg by mouth 2 (two) times daily as needed for dizziness., Disp: , Rfl:  .  oxybutynin (DITROPAN) 5 MG tablet, Take 1 tablet (5 mg total) by mouth 2 (two) times daily., Disp: 60 tablet, Rfl: 11 .  predniSONE (DELTASONE) 20 MG tablet, Take 6 pills po the first day and taper down to one pill over 6 days, Disp: 21 tablet, Rfl: 0 .  triamcinolone cream (KENALOG) 0.5 %, Apply 1 application topically 3 (three) times daily., Disp: 30 g, Rfl: 1 .  valACYclovir (VALTREX) 500 MG tablet, Take 1 tablet (500 mg total) by mouth 2 (  two) times daily., Disp: 60 tablet, Rfl: 3  ROS Const: Denies fevers  Respiratory: + dyspnea   OBJECTIVE: BP 130/80 (BP Location: Left Arm, Patient Position: Sitting, Cuff Size: Normal)   Pulse 89   Temp 98.3 F (36.8 C) (Oral)   Ht 6\' 1"  (1.854 m)   Wt 201 lb 2 oz (91.2 kg)   SpO2 94%   BMI 26.54 kg/m   Constitutional: -  VS reviewed -  Well developed, well nourished, appears stated age -  No apparent distress  Psychiatric: -  Oriented to person, place, and time -  Memory intact -  Affect and mood normal -  Fluent conversation, good eye contact -  Judgment and insight age appropriate  Eye: -  Conjunctivae clear, no discharge -  Pupils symmetric, round, reactive to  light  ENMT: -  + Maxillary sinus tenderness bilaterally -  Ears neg -  MMM    Pharynx moist, no exudate, no erythema  Neck: -  No gross swelling, no palpable masses -  Thyroid midline, not enlarged, mobile, no palpable masses  Cardiovascular: -  RRR -  No LE edema  Respiratory: -  Normal respiratory effort, no accessory muscle use, no retraction -  Breath sounds equal, no wheezes, no ronchi, no crackles  Gastrointestinal: -  Bowel sounds normal -  No tenderness, no distention, no guarding, no masses  Skin: -  No significant lesion on inspection -  Warm and dry to palpation   ASSESSMENT/PLAN: Acute maxillary sinusitis, recurrence not specified - Plan: doxycycline (VIBRA-TABS) 100 MG tablet  Hypokalemia - Plan: Basic metabolic panel  Multiple sclerosis (HCC) - Plan: CBC w/Diff  Tobacco abuse  Screen for colon cancer - Plan: Ambulatory referral to Gastroenterology  Need for tetanus booster - Plan: Tdap vaccine greater than or equal to 7yo IM  Treat for sinusitis.  It would also cover bacterial causes of upper respiratory infections.  I do suspect a component of COPD, but this would not explain his sinus tenderness. Stop smoking. Follow-up on labs. He does need a referral. Patient should return 1 month. The patient voiced understanding and agreement to the plan.   [place], DO 06/22/18  4:55 PM

## 2018-06-22 NOTE — Patient Instructions (Addendum)
Stop smoking.  Give [place] 2-3 business days to get the results of your labs back.  We will route the one result to Dr. Felecia Shelling.  Let us know if you need anything.  If you do not hear anything about your referral in the next 1-2 weeks, call our office and ask for an update.   Steps to Quit Smoking  Smoking tobacco can be harmful to your health and can affect almost every organ in your body. Smoking puts you, and those around you, at risk for developing many serious chronic diseases. Quitting smoking is difficult, but it is one of the best things that you can do for your health. It is never too late to quit. What are the benefits of quitting smoking? When you quit smoking, you lower your risk of developing serious diseases and conditions, such as:  Lung cancer or lung disease, such as COPD.  Heart disease.  Stroke.  Heart attack.  Infertility.  Osteoporosis and bone fractures. Additionally, symptoms such as coughing, wheezing, and shortness of breath may get better when you quit. You may also find that you get sick less often because your body is stronger at fighting off colds and infections. If you are pregnant, quitting smoking can help to reduce your chances of having a baby of low birth weight. How do I get ready to quit? When you decide to quit smoking, create a plan to make sure that you are successful. Before you quit:  Pick a date to quit. Set a date within the next two weeks to give you time to prepare.  Write down the reasons why you are quitting. Keep this list in places where you will see it often, such as on your bathroom mirror or in your car or wallet.  Identify the people, places, things, and activities that make you want to smoke (triggers) and avoid them. Make sure to take these actions: ? Throw away all cigarettes at home, at work, and in your car. ? Throw away smoking accessories, such as Scientist, research (medical). ? Clean your car and make sure to empty the  ashtray. ? Clean your home, including curtains and carpets.  Tell your family, friends, and coworkers that you are quitting. Support from your loved ones can make quitting easier.  Talk with your health care provider about your options for quitting smoking.  Find out what treatment options are covered by your health insurance. What strategies can I use to quit smoking? Talk with your healthcare provider about different strategies to quit smoking. Some strategies include:  Quitting smoking altogether instead of gradually lessening how much you smoke over a period of time. Research shows that quitting "cold [place]" is more successful than gradually quitting.  Attending in-person counseling to help you build problem-solving skills. You are more likely to have success in quitting if you attend several counseling sessions. Even short sessions of 10 minutes can be effective.  Finding resources and support systems that can help you to quit smoking and remain smoke-free after you quit. These resources are most helpful when you use them often. They can include: ? Online chats with a Social worker. ? Telephone quitlines. ? Careers information officer. ? Support groups or group counseling. ? Text messaging programs. ? Mobile phone applications.  Taking medicines to help you quit smoking. (If you are pregnant or breastfeeding, talk with your health care provider first.) Some medicines contain nicotine and some do not. Both types of medicines help with cravings, but the medicines that  include nicotine help to relieve withdrawal symptoms. Your health care provider may recommend: ? Nicotine patches, gum, or lozenges. ? Nicotine inhalers or sprays. ? Non-nicotine medicine that is taken by mouth. Talk with your health care provider about combining strategies, such as taking medicines while you are also receiving in-person counseling. Using these two strategies together makes you more likely to succeed in  quitting than if you used either strategy on its own. If you are pregnant or breastfeeding, talk with your health care provider about finding counseling or other support strategies to quit smoking. Do not take medicine to help you quit smoking unless told to do so by your health care provider. What things can I do to make it easier to quit? Quitting smoking might feel overwhelming at first, but there is a lot that you can do to make it easier. Take these important actions:  Reach out to your family and friends and ask that they support and encourage you during this time. Call telephone quitlines, reach out to support groups, or work with a counselor for support.  Ask people who smoke to avoid smoking around you.  Avoid places that trigger you to smoke, such as bars, parties, or smoke-break areas at work.  Spend time around people who do not smoke.  Lessen stress in your life, because stress can be a smoking trigger for some people. To lessen stress, try: ? Exercising regularly. ? Deep-breathing exercises. ? Yoga. ? Meditating. ? Performing a body scan. This involves closing your eyes, scanning your body from head to toe, and noticing which parts of your body are particularly tense. Purposefully relax the muscles in those areas.  Download or purchase mobile phone or tablet apps (applications) that can help you stick to your quit plan by providing reminders, tips, and encouragement. There are many free apps, such as QuitGuide from the [employer] Office manager for Disease Control and Prevention). You can find other support for quitting smoking (smoking cessation) through smokefree.gov and other websites. How will I feel when I quit smoking? Within the first 24 hours of quitting smoking, you may start to feel some withdrawal symptoms. These symptoms are usually most noticeable 2-3 days after quitting, but they usually do not last beyond 2-3 weeks. Changes or symptoms that you might experience include:  Mood  swings.  Restlessness, anxiety, or irritation.  Difficulty concentrating.  Dizziness.  Strong cravings for sugary foods in addition to nicotine.  Mild weight gain.  Constipation.  Nausea.  Coughing or a sore throat.  Changes in how your medicines work in your body.  A depressed mood.  Difficulty sleeping (insomnia). After the first 2-3 weeks of quitting, you may start to notice more positive results, such as:  Improved sense of smell and taste.  Decreased coughing and sore throat.  Slower heart rate.  Lower blood pressure.  Clearer skin.  The ability to breathe more easily.  Fewer sick days. Quitting smoking is very challenging for most people. Do not get discouraged if you are not successful the first time. Some people need to make many attempts to quit before they achieve long-term success. Do your best to stick to your quit plan, and talk with your health care provider if you have any questions or concerns. This information is not intended to replace advice given to you by your health care provider. Make sure you discuss any questions you have with your health care provider. Document Released: 06/15/2001 Document Revised: 01/25/2017 Document Reviewed: 11/05/2014 Elsevier Interactive Patient Education  2019 [place].

## 2018-06-23 LAB — BASIC METABOLIC PANEL
BUN: 7 mg/dL (ref 6–23)
CO2: 25 mEq/L (ref 19–32)
Calcium: 8.5 mg/dL (ref 8.4–10.5)
Chloride: 103 mEq/L (ref 96–112)
Creatinine, Ser: 0.89 mg/dL (ref 0.40–1.50)
GFR: 93.42 mL/min (ref >60.00)
Glucose, Bld: 93 mg/dL (ref 70–99)
POTASSIUM: 4 meq/L (ref 3.5–5.1)
SODIUM: 137 meq/L (ref 135–145)

## 2018-06-23 LAB — CBC WITH DIFFERENTIAL/PLATELET
Basophils Absolute: 0.1 10*3/uL (ref 0.0–0.1)
Basophils Relative: 0.7 % (ref 0.0–3.0)
EOS ABS: 0.1 10*3/uL (ref 0.0–0.7)
Eosinophils Relative: 0.6 % (ref 0.0–5.0)
HEMATOCRIT: 36.7 % — AB (ref 39.0–52.0)
Hemoglobin: 12.9 g/dL — ABNORMAL LOW (ref 13.0–17.0)
LYMPHS PCT: 5.9 % — AB (ref 12.0–46.0)
Lymphs Abs: 0.5 10*3/uL — ABNORMAL LOW (ref 0.7–4.0)
MCHC: 35.3 g/dL (ref 30.0–36.0)
MCV: 99.2 fl (ref 78.0–100.0)
MONO ABS: 0.5 10*3/uL (ref 0.1–1.0)
Monocytes Relative: 5.6 % (ref 3.0–12.0)
Neutro Abs: 7.8 10*3/uL — ABNORMAL HIGH (ref 1.4–7.7)
Neutrophils Relative %: 87.2 % — ABNORMAL HIGH (ref 43.0–77.0)
PLATELETS: 215 10*3/uL (ref 150.0–400.0)
RBC: 3.7 Mil/uL — ABNORMAL LOW (ref 4.22–5.81)
RDW: 15 % (ref 11.5–15.5)
WBC: 9 10*3/uL (ref 4.0–10.5)

## 2018-07-20 ENCOUNTER — Ambulatory Visit (INDEPENDENT_AMBULATORY_CARE_PROVIDER_SITE_OTHER): Payer: Medicare Other | Admitting: Family Medicine

## 2018-07-20 ENCOUNTER — Encounter: Payer: Self-pay | Admitting: Family Medicine

## 2018-07-20 VITALS — BP 132/76 | HR 71 | Temp 98.5°F | Ht 73.0 in | Wt 199.1 lb

## 2018-07-20 DIAGNOSIS — J309 Allergic rhinitis, unspecified: Secondary | ICD-10-CM

## 2018-07-20 DIAGNOSIS — Z720 Tobacco use: Secondary | ICD-10-CM | POA: Diagnosis not present

## 2018-07-20 DIAGNOSIS — J439 Emphysema, unspecified: Secondary | ICD-10-CM | POA: Insufficient documentation

## 2018-07-20 MED ORDER — ALBUTEROL SULFATE 108 (90 BASE) MCG/ACT IN AEPB: 1.0000 | INHALATION_SPRAY | RESPIRATORY_TRACT | 1 refills | Status: DC | PRN

## 2018-07-20 MED ORDER — LEVOCETIRIZINE DIHYDROCHLORIDE 5 MG PO TABS: 5.0000 mg | ORAL_TABLET | Freq: Every evening | ORAL | 2 refills | Status: DC

## 2018-07-20 NOTE — Progress Notes (Signed)
Pre visit review using our clinic review tool, if applicable. No additional management support is needed unless otherwise documented below in the visit note. 

## 2018-07-20 NOTE — Progress Notes (Signed)
Chief Complaint  Patient presents with  . Follow-up    Subjective: Patient is a 58 y.o. male here for f/u sinus pain.  Still having some drainage and cough. Clear sputum now, feels better overall. No longer having pain after finishing doxy. Is still smoking. No fevers or URI s/s's. He is not taking anything for allergies and has never had PFT's. Not taking any inhalers.   ROS: Heart: Denies chest pain  Lungs: +cough   Past Medical History:  Diagnosis Date  . Atrial tachycardia ([place])   . Basal cell carcinoma of face   . Cancer ([place])   . Headache   . Multiple sclerosis ([place])   . Vision abnormalities     Objective: BP 132/76 (BP Location: Left Arm, Patient Position: Sitting, Cuff Size: Normal)   Pulse 71   Temp 98.5 F (36.9 C) (Oral)   Ht 6\' 1"  (1.854 m)   Wt 199 lb 2 oz (90.3 kg)   SpO2 95%   BMI 26.27 kg/m  General: Awake, appears stated age HEENT: MMM, EOMi, nares patent, no sinus ttp, ears neg Heart: RRR, no LE edema Lungs: Distant breath sounds, CTAB, no rales, wheezes or rhonchi. No accessory muscle use Psych: Age appropriate judgment and insight, normal affect and mood  Assessment and Plan: Allergic rhinitis, unspecified seasonality, unspecified trigger - Plan: Albuterol Sulfate 108 (90 Base) MCG/ACT AEPB  Tobacco abuse  Pulmonary emphysema, unspecified emphysema type ([place]) - Plan: levocetirizine (XYZAL) 5 MG tablet  #1- trial PO antihistamine #2- stop smoking #3- SABA trial. F/u in 1 mo. If still having issues, will refer to ENT for sinuses and possibly obtain PFT's for cough.  The patient voiced understanding and agreement to the plan.  [place], DO 07/20/18  3:01 PM

## 2018-07-20 NOTE — Patient Instructions (Signed)
Let me know if medicine is too expensive.  Claritin (loratadine), Allegra (fexofenadine), Zyrtec (cetirizine); these are listed in order from weakest to strongest. Generic, and therefore cheaper, options are in the parentheses.   There are available OTC, and the generic versions, which may be cheaper, are in parentheses. Show this to a pharmacist if you have trouble finding any of these items.  Let us know if you need anything.

## 2018-07-26 ENCOUNTER — Encounter: Payer: Self-pay | Admitting: Family Medicine

## 2018-08-01 ENCOUNTER — Encounter: Payer: Self-pay | Admitting: Neurology

## 2018-08-01 ENCOUNTER — Ambulatory Visit: Payer: Medicare Other | Admitting: Neurology

## 2018-08-01 VITALS — BP 129/84 | HR 63 | Wt 205.5 lb

## 2018-08-01 DIAGNOSIS — Z79899 Other long term (current) drug therapy: Secondary | ICD-10-CM

## 2018-08-01 DIAGNOSIS — R609 Edema, unspecified: Secondary | ICD-10-CM | POA: Diagnosis not present

## 2018-08-01 DIAGNOSIS — M79604 Pain in right leg: Secondary | ICD-10-CM

## 2018-08-01 DIAGNOSIS — Z9221 Personal history of antineoplastic chemotherapy: Secondary | ICD-10-CM | POA: Diagnosis not present

## 2018-08-01 DIAGNOSIS — M5416 Radiculopathy, lumbar region: Secondary | ICD-10-CM

## 2018-08-01 DIAGNOSIS — G35 Multiple sclerosis: Secondary | ICD-10-CM

## 2018-08-01 MED ORDER — FUROSEMIDE 20 MG PO TABS: 20.0000 mg | ORAL_TABLET | Freq: Every day | ORAL | 0 refills | Status: DC

## 2018-08-01 MED ORDER — AMPHETAMINE-DEXTROAMPHETAMINE 10 MG PO TABS: 10.0000 mg | ORAL_TABLET | Freq: Two times a day (BID) | ORAL | 0 refills | Status: DC

## 2018-08-01 MED ORDER — OXYCODONE HCL 5 MG PO TABS: ORAL_TABLET | ORAL | 0 refills | Status: DC

## 2018-08-01 NOTE — Progress Notes (Signed)
[facility]  PATIENT: Don Hamilton DOB: [birth year]  REFERRING DOCTOR OR PCP:  None SOURCE: patient, records from [facility].  _________________________________   HISTORICAL  CHIEF COMPLAINT:  Chief Complaint  Patient presents with  . Follow-up    RM 12, alone. Last seen 05/22/18. States he feels much worse since last seen. He expressed frustration with how he is feeling. He is in more pain. Voice has returned some but not back to baseline    HISTORY Don Hamilton is a 58 y.o. man with multiple sclerosis.  Update 08/01/2018:   He received the first 5 days of Mavenclad between 05/02/2018 and 05/06/2018.  We never did a second course because of a very severe rash associated with headache, diarrhea and much more fatigue.     The absolute lymphocyte count 06/22/2018 is 0.5.  It was 0.2 on 05/22/2018.   His rash resolved.     He stopped Valtrex.      He continues to feel poorly.  He has more pain and continues to be very fatigued.   On January 4th he had swelling in his feet x 10 days with severe pain that affected his walking.  He is still having a lot of swelling/edema.    His back is hurting more.   Pain is lumbar and radiates into the legs.   His voice has been course since the [facility].  He has mild neck pain but back is much worse  He has a lot of fatigue.   Adderall has helped, even at 5 mg (1/2 pill).   It curbs his appetite if he takes 10 mg.     He is still on gabapentin, baclofen and flexeril.    These help some.     He denies any fevers.  Update 05/22/2018: He had his 5 days of Mavenclad between 05/02/2018 and 05/06/2018.  On 05/13/2018, he began to experience a rash, headache, diarrhea and felt much more fatigued.  He had some hydroxyzine and triamcinolone ointment and took those with some benefit -experiencing less itching.  He called our office a couple days later and I spoke to him 05/19/2018.  At that time, he noted that the rash was improved in the  shoulder region but was about the same in the trunk and the thighs.   I sent in a prescription for a 6-day steroid taper from 120 mg down to 20 mg.  He is halfway through.    Currently, he is experiencing a severe headache across the forehead.     He has a mild rash in the inguinal region and adjacent abdomen and thighs and a little in the armpit.   This is improved compared to the initial rash.      He had the shingles and flu vaccines about  6 weeks and noted his voice became more raspy and he had coughing.       Currently, the rash is better.   This morning, he had more rash but it is better now than earlier today.     He was diagnosed with MS in 2008/2009 but was told he likely had it a while given the chronic appearance.  He had rashes and welts on Avonex and Betaseron.  He had diarrhea (severe) on Aubagio.   Tecfidera caused a rash.    He has an atrial tachycardia and has had ablations so I've been reluctant to have him do Gilenya/Mayzent.     Update 05/02/2018: He will be starting Mavenclad and  he finally got his medication.     He will be followed in the [facility] study ([facility]).   His last medication was Tecfidera but he had rash.      Gait is doing the same ans he uses a cane.   Some stumbles but no falls.   His right leg is weaker, stiffer and more painful than the left.    Some numbness in the right leg and both ankles.      Update 03/22/2018: He tried Tecfidera again but had difficulties with possible allergic reaction with hives and burning in the skin so he stopped after a few months (last dose June 2019).  Before that, he was on alemtuzumab but had severe infusion reactions with hives for several weeks.  He also had infusion reactions on Tysabri.  Earlier, he was on Betaseron, Aubagio but had difficulty tolerating them.   Due to some EKG changes, Gilenya was considered but not tried..   We had a conversation about some of the treatment options including Mavenclad and Mayzent.  He  feels his gait is doing about the same as earlier this year.  He is unstable and uses a cane.  He notes some weakness in his legs and mild spasticity.  He notes a lot of fatigue and insomnia is also doing worse.  He gets some benefit from Adderall for the fatigue and needs a new refill.  He felt he did better with the immediate release Adderall than the time release.  Doxepin helps the insomnia some.  Oxybutynin has helped his urinary frequency.   Update 09/01/2017:   He started the Tecfidera but stopped  a couple months ago because he was having side effects including flushing and rash/hives.  The rash would last the whole day.   Of note, he did have one course of treatment with alemtuzumab in 2011 (off label at that time) but did not have the second years infusion due to infusion reactions.     Before alemtuzumab, he was on Tysabri but had severe infusion reactions and needed to stop.   He also had been at various times on Betaseron, Aubagio, Gilenya.    He is having difficulty with his gait and relies on a cane constantly now. Going up and down stairs is difficult. He stumbles and has occasional falls. He has spasticity mostly in the legs. Baclofen gives him some benefit. He continues to have some difficulty with bladder frequency and urgency. This is helped a little bit with the oxybutynin.   Vision is doing about the same (mild reduced on left). The patient is doing about the same as last visit.  He feels his sleep and fatigue are both doing worse. Insomnia has been a chronic issue. He does sleep a little bit better when he takes gabapentin, Flexeril and baclofen at bedtime.    He tried Nuvigil for fatigue but there was no benefit.     Depression and anxiety are both worsening. Years ago he was on Lexapro with some benefit.  He does have basal cell carcinoma (nose) and had surgery.   From 08/04/2016: MS:   He remains off any DMT.   He was on Alemtuzumab about 2011 but had side effects and never had  the second year.    Gait/strength/sensation/Pain: He has had difficulty with his gait since 2000. About 10 years ago he started to use a cane.  He continues to use a cane.   Notes transient numbness in the arms and legs.  The numbness and tingling is fairly random many does not identify a trigger.   He ran out of baclofen and felt that it did help his spasticity.      Pain:  He notes pain in the right greater than left leg associated with spasticity. Hip area is most painful.   This has been evaluated and an orthopedic reason has not been identified.    He also notes neck and shoulder pain.    He currently is taking ibuprofen 800 mg a few times a day.   He takes Aleve.   He had a UDS that was positive for cocaine in 2016.  He denies any use of it.  We discussed that for the time being I can't write any controlled substances for him.     Bladder:   His bladder function is doing weld he tolerates it welll on oxybutynin . There is just minimal frequency and he has nocturia x 1.  Vision: He notes mildly reduced visual acuity out of both eyes. He has altered color vision out of the left eye.  Fatigue/sleep:   He reports some cognitive and physical fatigue. He had mild benefit for the fatigue with the Adderall but it was not well tolerated. Nuvigil was tried in the past but was not helpful. He reports difficulty falling and staying asleep.  He did not take the doxepin.   He feels unrefreshed in the morning.   A PSG did not show OSA.   Mood/cognition: He notes mild depression and some anxiety.  Anti depressants had not helped much so he stopped.   He was working as an Art gallery manager in the past.   Cognitive issues are his main concernHe has difficulty with cognition, mostly problems with reduced attention, short-term memory and executive function.    MS History:   He presented in 1998 with left optic neuritis. An MRI of the brain was consistent with multiple sclerosis. He was started on Betaseron but he stopped  due to allergic reactions and injection site reactions. Avonex was tried but since he stopped after a while due to rash. When Tysabri was reintroduced around 2006, he went on that for about a year or so. However, he would get a rash with each infusion. Therefore, it was discontinued and he was placed on off label Campath. He received his first year dose but had severe infusion reactions with rash that lasted over a month. Therefore, sucking year Campath was not performed. In 2015, he was placed on Aubagio. After month it was stopped due to diarrhea he has not been on Tecfidera. Gilenya was considered but he does have a cardiac arrhythmia. Last year, Dr. Alton Revere placed him on Copaxone but he decided not to take it as it is generally weaker than other medications he has been on.         REVIEW OF SYSTEMS: Constitutional: No fevers, chills, sweats, or change in appetite.   He has fatigue and insomnia Eyes: No visual changes, double vision, eye pain Ear, nose and throat: No hearing loss, ear pain, nasal congestion, sore throat Cardiovascular: No chest pain, palpitations Respiratory: No shortness of breath at rest or with exertion.   No wheezes GastrointestinaI: No nausea, vomiting, diarrhea, abdominal pain, fecal incontinence Genitourinary:He has urinary frequency.. Musculoskeletal: No neck pain.  Notes right hip > back pain Integumentary: No rash, pruritus, skin lesions Neurological: as above Psychiatric: Currently denies depression or anxiety. Endocrine: No palpitations, diaphoresis, change in appetite, change in weigh or increased thirst  Hematologic/Lymphatic: No anemia, purpura, petechiae. Allergic/Immunologic: No itchy/runny eyes, nasal congestion, recent allergic reactions, rashes  ALLERGIES: Allergies  Allergen Reactions  . Campath [Alemtuzumab] Hives  . Mavenclad [Cladribine]     Flu-like sx  . Avonex [Interferon Beta-1a] Rash  . Betaseron [Interferon Beta-1b] Rash  . Tysabri  [Natalizumab] Rash    HOME MEDICATIONS:  Current Outpatient Medications:  .  Albuterol Sulfate 108 (90 Base) MCG/ACT AEPB, Inhale 1-2 puffs into the lungs every 4 (four) hours as needed (sob, chest tightness, wheezing, cough)., Disp: 1 each, Rfl: 1 .  amphetamine-dextroamphetamine (ADDERALL) 10 MG tablet, Take 1 tablet (10 mg total) by mouth 2 (two) times daily with a meal., Disp: 60 tablet, Rfl: 0 .  baclofen (LIORESAL) 10 MG tablet, Take 1 tablet (10 mg total) by mouth 4 (four) times daily., Disp: 120 each, Rfl: 11 .  cyclobenzaprine (FLEXERIL) 5 MG tablet, One to two at bedtime, Disp: 60 tablet, Rfl: 11 .  doxepin (SINEQUAN) 10 MG capsule, Take 1 capsule (10 mg total) by mouth at bedtime., Disp: 30 capsule, Rfl: 11 .  escitalopram (LEXAPRO) 20 MG tablet, Take 1 tablet (20 mg total) by mouth daily., Disp: 90 tablet, Rfl: 1 .  gabapentin (NEURONTIN) 300 MG capsule, Take 1 capsule (300 mg total) by mouth 3 (three) times daily., Disp: 270 capsule, Rfl: 3 .  hydrOXYzine (ATARAX/VISTARIL) 10 MG tablet, Take 1 tablet (10 mg total) by mouth 3 (three) times daily as needed., Disp: 90 tablet, Rfl: 5 .  levocetirizine (XYZAL) 5 MG tablet, Take 1 tablet (5 mg total) by mouth every evening., Disp: 30 tablet, Rfl: 2 .  oxybutynin (DITROPAN) 5 MG tablet, Take 1 tablet (5 mg total) by mouth 2 (two) times daily., Disp: 60 tablet, Rfl: 11 .  triamcinolone cream (KENALOG) 0.5 %, Apply 1 application topically 3 (three) times daily., Disp: 30 g, Rfl: 1 .  furosemide (LASIX) 20 MG tablet, Take 1 tablet (20 mg total) by mouth daily., Disp: 30 tablet, Rfl: 0 .  oxyCODONE (ROXICODONE) 5 MG immediate release tablet, Take up to twice a day for pain prn, Disp: 30 tablet, Rfl: 0  PAST MEDICAL HISTORY: Past Medical History:  Diagnosis Date  . Atrial tachycardia (HCC)   . Basal cell carcinoma of face   . Cancer (HCC)   . Headache   . Multiple sclerosis (HCC)   . Vision abnormalities     PAST SURGICAL  HISTORY: Past Surgical History:  Procedure Laterality Date  . CARDIAC ELECTROPHYSIOLOGY MAPPING AND ABLATION    . SKIN CANCER EXCISION  2016   nose  . TONSILLECTOMY      FAMILY HISTORY: Family History  Problem Relation Age of Onset  . Healthy Mother   . Stroke Father   . Heart attack Father   . Leukemia Father     SOCIAL HISTORY:  Social History   Socioeconomic History  . Marital status: Married    Spouse name: Not on file  . Number of children: Not on file  . Years of education: Not on file  . Highest education level: Not on file  Occupational History  . Not on file  Social Needs  . Financial resource strain: Not on file  . Food insecurity:    Worry: Not on file    Inability: Not on file  . Transportation needs:    Medical: Not on file    Non-medical: Not on file  Tobacco Use  . Smoking status: Current Every Day Smoker  Packs/day: 0.75    Start date: 35  . Smokeless tobacco: Never Used  Substance and Sexual Activity  . Alcohol use: Yes    Alcohol/week: 0.0 standard drinks    Comment: occasional beer  . Drug use: No  . Sexual activity: Not on file  Lifestyle  . Physical activity:    Days per week: Not on file    Minutes per session: Not on file  . Stress: Not on file  Relationships  . Social connections:    Talks on phone: Not on file    Gets together: Not on file    Attends religious service: Not on file    Active member of club or organization: Not on file    Attends meetings of clubs or organizations: Not on file    Relationship status: Not on file  . Intimate partner violence:    Fear of current or ex partner: Not on file    Emotionally abused: Not on file    Physically abused: Not on file    Forced sexual activity: Not on file  Other Topics Concern  . Not on file  Social History Narrative  . Not on file     PHYSICAL EXAM  Vitals:   08/01/18 1302  BP: 129/84  Pulse: 63  Weight: 205 lb 8 oz (93.2 kg)    Body mass index is 27.11  kg/m.   General: The patient is well-developed and well-nourished and in no acute distress.   He has 3+ edema at ankles.     Neurologic Exam  Mental status: The patient is alert and oriented x 3 at the time of the examination. The patient has apparent normal recent and remote memory, with reuced attention span and concentration ability.   Speech is normal.  Cranial nerves: Extraocular movements are full.    Facial strength and sensation is normal. Trapezius strength is strong..  The tongue is midline, and the patient has symmetric elevation of the soft palate. No obvious hearing deficits are noted.  Motor:  Muscle bulk is normal.  Muscle tone is increased in the legs. Strength is 4/5 in the right hip flexors and right toe and ankle extensors..   Sensory: Sensory testing shows reduced sensation in right foot, worse in S1 dermatome.    Coordination: Finger-nose-finger is performed well. Heel-to-shin is performed poorly on both legs.   Gait and station: Station is normal.  Gait is wide and arthritic.  The tandem gait is wide.  Romberg is negative.       Reflexes: Deep tendon reflexes are symmetric and increased in legs , R>L. He has crossed adductor responses.    No ankle clonus plantar responses are flexor.      DIAGNOSTIC DATA (LABS, IMAGING, TESTING) - I reviewed patient records, labs, notes, testing and imaging myself where available.      ASSESSMENT AND PLAN  Multiple sclerosis (HCC) - Plan: CBC with Differential/Platelet, Comprehensive metabolic panel  High risk medication use - Plan: CBC with Differential/Platelet, Comprehensive metabolic panel  Peripheral edema - Plan: ECHOCARDIOGRAM COMPLETE  History of chemotherapy - Plan: ECHOCARDIOGRAM COMPLETE  Lumbar radiculopathy - Plan: MR LUMBAR SPINE WO CONTRAST  Leg pain, anterior, right - Plan: MR LUMBAR SPINE WO CONTRAST   1.   He has felt poorly since he had Mavenclad couple months ago.  We will recheck some blood  work and make sure that the lymphocytes continue to recover.  Unfortunately, he has had side effects with every MS disease modifying  therapy we have tried.   2.   He has a lot more pedal edema.  I will have him take Lasix daily for a few weeks to see if that helps.  Additionally we will check an echocardiogram to make sure that there is not any diastolic dysfunction that may be contributing.   3.   Back pain is worsening we will check an MRI of the lumbar spine.  Oxycodone prn due to increased pain,  4.   renew Adderall for attention deficit and MS fatigue. 5.   Return to see me in 3 months or sooner if there are new or worsening neurologic symptoms.    Sekou A. Epimenio Foot, MD, PhD 08/01/2018, 10:49 PM Certified in Neurology, Clinical Neurophysiology, Sleep Medicine, Pain Medicine and Neuroimaging  [facility] [address], [address] [place], [place] [address] [phone]

## 2018-08-02 ENCOUNTER — Telehealth: Payer: Self-pay | Admitting: *Deleted

## 2018-08-02 ENCOUNTER — Other Ambulatory Visit: Payer: Self-pay | Admitting: *Deleted

## 2018-08-02 ENCOUNTER — Telehealth: Payer: Self-pay | Admitting: Neurology

## 2018-08-02 DIAGNOSIS — G35 Multiple sclerosis: Secondary | ICD-10-CM

## 2018-08-02 DIAGNOSIS — Z79899 Other long term (current) drug therapy: Secondary | ICD-10-CM

## 2018-08-02 LAB — CBC WITH DIFFERENTIAL/PLATELET
Basophils Absolute: 0 10*3/uL (ref 0.0–0.2)
Basos: 0 %
EOS (ABSOLUTE): 0.1 10*3/uL (ref 0.0–0.4)
Eos: 1 %
HEMATOCRIT: 37.7 % (ref 37.5–51.0)
Hemoglobin: 13.2 g/dL (ref 13.0–17.7)
Immature Grans (Abs): 0 10*3/uL (ref 0.0–0.1)
Immature Granulocytes: 0 %
LYMPHS ABS: 0.4 10*3/uL — AB (ref 0.7–3.1)
Lymphs: 7 %
MCH: 35.2 pg — ABNORMAL HIGH (ref 26.6–33.0)
MCHC: 35 g/dL (ref 31.5–35.7)
MCV: 101 fL — AB (ref 79–97)
MONOS ABS: 0.4 10*3/uL (ref 0.1–0.9)
Monocytes: 7 %
Neutrophils Absolute: 4.4 10*3/uL (ref 1.4–7.0)
Neutrophils: 85 %
Platelets: 159 10*3/uL (ref 150–450)
RBC: 3.75 x10E6/uL — AB (ref 4.14–5.80)
RDW: 12.5 % (ref 11.6–15.4)
WBC: 5.2 10*3/uL (ref 3.4–10.8)

## 2018-08-02 LAB — COMPREHENSIVE METABOLIC PANEL
A/G RATIO: 2 (ref 1.2–2.2)
ALK PHOS: 70 IU/L (ref 39–117)
ALT: 20 IU/L (ref 0–44)
AST: 22 IU/L (ref 0–40)
Albumin: 4.1 g/dL (ref 3.8–4.9)
BUN/Creatinine Ratio: 8 — ABNORMAL LOW (ref 9–20)
BUN: 8 mg/dL (ref 6–24)
Bilirubin Total: 0.9 mg/dL (ref 0.0–1.2)
CO2: 23 mmol/L (ref 20–29)
Calcium: 8.7 mg/dL (ref 8.7–10.2)
Chloride: 103 mmol/L (ref 96–106)
Creatinine, Ser: 1 mg/dL (ref 0.76–1.27)
GFR calc Af Amer: 96 mL/min/{1.73_m2} (ref >59)
GFR calc non Af Amer: 83 mL/min/{1.73_m2} (ref >59)
GLOBULIN, TOTAL: 2.1 g/dL (ref 1.5–4.5)
Glucose: 97 mg/dL (ref 65–99)
POTASSIUM: 3.8 mmol/L (ref 3.5–5.2)
SODIUM: 139 mmol/L (ref 134–144)
Total Protein: 6.2 g/dL (ref 6.0–8.5)

## 2018-08-02 NOTE — Telephone Encounter (Signed)
-----   Message from Britt Bottom, MD sent at 08/02/2018  2:30 PM EST ----- Please let him know that the labs are okay.  The lymphocyte count is still a little low but that is common with Mavenclad.  We would like recheck again in a few months.  We will call with the results of the other studies when they are done.

## 2018-08-02 NOTE — Telephone Encounter (Signed)
[facility] Medicare order sent to GI. No aut they will reach out to the pt to schedule.

## 2018-08-02 NOTE — Telephone Encounter (Signed)
Called, LVM for pt relaying results per Dr. Felecia Shelling note. Gave [facility] phone number if he has further questions/concerns. Placed order for repeat CBC w/ diff to be done in a few months.

## 2018-08-02 NOTE — Telephone Encounter (Signed)
Patient would like to have MRI at [place] will reach out to the patient to schedule.  The echocardiogram is scheduled for 08/04/18 for at [facility] point arrival time is 10:30 AM. I left a vmail for patient to be aware. I also left their number of [phone] if he needed to r/s for any reason.

## 2018-08-04 ENCOUNTER — Other Ambulatory Visit (HOSPITAL_BASED_OUTPATIENT_CLINIC_OR_DEPARTMENT_OTHER): Payer: Medicare Other

## 2018-08-11 ENCOUNTER — Ambulatory Visit (HOSPITAL_BASED_OUTPATIENT_CLINIC_OR_DEPARTMENT_OTHER)
Admission: RE | Admit: 2018-08-11 | Discharge: 2018-08-11 | Disposition: A | Discharge status: Home / Self Care | Payer: Medicare Other | Source: Ambulatory Visit | Attending: Neurology | Admitting: Neurology

## 2018-08-11 DIAGNOSIS — Z9221 Personal history of antineoplastic chemotherapy: Secondary | ICD-10-CM | POA: Diagnosis present

## 2018-08-11 DIAGNOSIS — I77810 Thoracic aortic ectasia: Secondary | ICD-10-CM | POA: Insufficient documentation

## 2018-08-11 DIAGNOSIS — I517 Cardiomegaly: Secondary | ICD-10-CM | POA: Insufficient documentation

## 2018-08-11 DIAGNOSIS — R002 Palpitations: Secondary | ICD-10-CM | POA: Insufficient documentation

## 2018-08-11 DIAGNOSIS — R609 Edema, unspecified: Secondary | ICD-10-CM | POA: Insufficient documentation

## 2018-08-11 NOTE — Progress Notes (Signed)
  Echocardiogram 2D Echocardiogram has been performed.  Don Hamilton 08/11/2018, 1:53 PM

## 2018-08-12 ENCOUNTER — Ambulatory Visit (HOSPITAL_BASED_OUTPATIENT_CLINIC_OR_DEPARTMENT_OTHER)
Admission: RE | Admit: 2018-08-12 | Discharge: 2018-08-12 | Disposition: A | Discharge status: Home / Self Care | Payer: Medicare Other | Source: Ambulatory Visit | Attending: Neurology | Admitting: Neurology

## 2018-08-12 DIAGNOSIS — M79604 Pain in right leg: Secondary | ICD-10-CM | POA: Insufficient documentation

## 2018-08-12 DIAGNOSIS — M5416 Radiculopathy, lumbar region: Secondary | ICD-10-CM

## 2018-08-18 ENCOUNTER — Telehealth: Payer: Self-pay | Admitting: *Deleted

## 2018-08-18 DIAGNOSIS — I517 Cardiomegaly: Secondary | ICD-10-CM

## 2018-08-18 DIAGNOSIS — M5416 Radiculopathy, lumbar region: Secondary | ICD-10-CM

## 2018-08-18 NOTE — Telephone Encounter (Signed)
Duplicate

## 2018-08-18 NOTE — Telephone Encounter (Signed)
Pt called back. I relayed results per Dr. Felecia Shelling note. He is agreeable to try ESI. He is still having pain. I placed order.  He would like referral to cardiologist placed somewhere in [facility]. His PCP is at [facility] and wondering if he can go there if possible. Advised we will put in order for him to go there. He verbalized understanding and appreciation. I placed referral.

## 2018-08-18 NOTE — Telephone Encounter (Signed)
-----   Message from Britt Bottom, MD sent at 08/18/2018  9:16 AM EST ----- Please let him know:  1.   The MRI of the lumbar spine showed some progression in the degenerative changes at L4-L5.  This was towards the right and could affect the right L4 nerve root.  If pain is not any better, we could refer her for an epidural steroid injection at L4-L5.  2.   The echocardiogram showed mild left ventricular hypertrophy (thickness) which causes mild reduced pumping efficiency and mild reduced relaxation of the heart after each beat.   I would like him to see cardiology to see if any med's should be changed.     Please refer to cardiology (he lives in [place])

## 2018-08-18 NOTE — Telephone Encounter (Signed)
Called, LVM for pt to call about results.  

## 2018-08-21 ENCOUNTER — Ambulatory Visit (INDEPENDENT_AMBULATORY_CARE_PROVIDER_SITE_OTHER): Payer: Medicare Other | Admitting: Family Medicine

## 2018-08-21 ENCOUNTER — Encounter: Payer: Self-pay | Admitting: Family Medicine

## 2018-08-21 VITALS — BP 138/82 | HR 70 | Temp 98.1°F | Ht 74.0 in | Wt 205.4 lb

## 2018-08-21 DIAGNOSIS — J439 Emphysema, unspecified: Secondary | ICD-10-CM | POA: Diagnosis not present

## 2018-08-21 DIAGNOSIS — R490 Dysphonia: Secondary | ICD-10-CM

## 2018-08-21 MED ORDER — PREDNISONE 20 MG PO TABS: 40.0000 mg | ORAL_TABLET | Freq: Every day | ORAL | 0 refills | Status: AC

## 2018-08-21 NOTE — Telephone Encounter (Signed)
Pt has left 2 msg with GI and has not rec'd a return call. He is wanting referral sent to another facility. Please call to advise

## 2018-08-21 NOTE — Patient Instructions (Addendum)
OK to use Xyzal as needed at this point.  If you do not hear anything about your referral in the next 1-2 weeks, call our office and ask for an update.   Let us know how you do on the inhaler.   Let us know if you need anything.

## 2018-08-21 NOTE — Progress Notes (Signed)
Chief Complaint  Patient presents with  . Follow-up    Subjective: Patient is a 58 y.o. male here for f/u sinus pain/hoarseness.  Has not done well with Xyzal. Still having hoarseness. Still having cough/sob. He is a smoker. Did not get SABA as it is on order, should be sometime this week though.   +hx of chronic LBP following with Dr Felecia Shelling of Neuro. MRI shows bulge and degeneration. Will have inj in future. Requesting supply of prednisone to hold him over until procedure (which is hopefully soon).    ROS: MSK: +LBP Lungs: +cough  Past Medical History:  Diagnosis Date  . Atrial tachycardia ([place])   . Basal cell carcinoma of face   . Cancer ([place])   . Headache   . Multiple sclerosis ([place])   . Vision abnormalities     Objective: BP 138/82 (BP Location: Right Arm, Patient Position: Sitting, Cuff Size: Normal)   Pulse 70   Temp 98.1 F (36.7 C) (Oral)   Ht 6\' 2"  (1.88 m)   Wt 205 lb 6 oz (93.2 kg)   SpO2 95%   BMI 26.37 kg/m  General: Awake, appears stated age HEENT: MMM, EOMi, ears neg, nares patent w/o dc Heart: RRR Lungs: CTAB, no rales, wheezes or rhonchi. No accessory muscle use Psych: Age appropriate judgment and insight, normal affect and mood  Assessment and Plan: Hoarseness - Plan: Ambulatory referral to ENT  Pulmonary emphysema, unspecified emphysema type [facility])  Refer ENT. OK to change Xyzal to prn. Await results of SABA.  Pred burst also called in. The patient voiced understanding and agreement to the plan.  [place], DO 08/21/18  1:53 PM

## 2018-08-21 NOTE — Progress Notes (Signed)
Pre visit review using our clinic review tool, if applicable. No additional management support is needed unless otherwise documented below in the visit note. 

## 2018-08-22 NOTE — Telephone Encounter (Signed)
I called the patient but he did not answer so I left a VM asking him to call me back. It looks like his lumbar puncture has already been scheduled.

## 2018-08-24 ENCOUNTER — Other Ambulatory Visit: Payer: Self-pay | Admitting: Neurology

## 2018-08-28 ENCOUNTER — Ambulatory Visit
Admission: RE | Admit: 2018-08-28 | Discharge: 2018-08-28 | Disposition: A | Discharge status: Home / Self Care | Payer: Medicare Other | Source: Ambulatory Visit | Attending: Neurology | Admitting: Neurology

## 2018-08-28 DIAGNOSIS — M5416 Radiculopathy, lumbar region: Secondary | ICD-10-CM

## 2018-08-28 MED ORDER — IOPAMIDOL (ISOVUE-M 200) INJECTION 41%
1.0000 mL | Freq: Once | INTRAMUSCULAR | Status: AC
  Administered 2018-08-28: 1 mL via EPIDURAL

## 2018-08-28 MED ORDER — METHYLPREDNISOLONE ACETATE 40 MG/ML INJ SUSP (RADIOLOG
120.0000 mg | Freq: Once | INTRAMUSCULAR | Status: AC
  Administered 2018-08-28: 120 mg via EPIDURAL

## 2018-08-28 NOTE — Discharge Instructions (Signed)

## 2018-08-28 NOTE — Progress Notes (Signed)
Cardiology Office Note:    Date:  08/30/2018   ID:  Don Hamilton, DOB 1960/11/27, MRN [id number]  PCP:  Sharlene Dory, DO  Cardiologist:  Norman Herrlich, MD    Referring MD: Asa Lente, MD    ASSESSMENT:    1. 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 Bilateral lower extremity edema   3. Abnormal echocardiogram    PLAN:    In order of problems listed above:  1. He has evidence of heart failure and cardiomyopathy I suspect is related to agents to treat multiple sclerosis he is often LV dysfunction is mild he will start a loop diuretic check proBNP and make a decision about the need for vasodilator ARB or beta-blocker. 2. Due to heart failure plus the effect of gabapentin he told me he cannot decrease the dose no need to take a loop diuretic on a regular basis 3. Consistent with cardiomyopathy secondary to agents used for treatment of his multiple sclerosis with chemotherapy and monoclonal antibody or potential etiologies   Next appointment: 4 weeks   Medication Adjustments/Labs and Tests Ordered: Current medicines are reviewed at length with the patient today.  Concerns regarding medicines are outlined above.  Orders Placed This Encounter  Procedures  . Pro b natriuretic peptide (BNP)  . Basic Metabolic Panel (BMET)  . EKG 12-Lead   Meds ordered this encounter  Medications  . furosemide (LASIX) 20 MG tablet    Sig: Take 1 tablet (20 mg total) by mouth daily.    Dispense:  30 tablet    Refill:  0    Chief Complaint  Patient presents with  . New Patient (Initial Visit)    abnormal echo     History of Present Illness:    Don Hamilton is a 58 y.o. male with a hx of an abnormal echo with mildly reduced EF of 45-50%  for edema referred by Dr Epimenio Foot.he is taking gabapentin which favor sodium retention and edema. He has a history of SVT and ablation. Compliance with diet, lifestyle and medications: Yes  08/11/18:   FINDINGS  Left Ventricle: The left  ventricle has mildly reduced systolic function of 45-50%. The cavity size was normal. There is concentric left ventricular hypertrophy. Echo evidence of impaired diastolic relaxation Normal left ventricular filling pressures No  evidence of left ventricular regional wall motion abnormalities.. Right Ventricle: The right ventricle has normal systolic function. The cavity was normal. There is no increase in right ventricular wall thickness. Left Atrium: left atrial size was normal in size Right Atrium: right atrial size was normal in size Interatrial Septum: No atrial level shunt detected by color flow Doppler.  Since treatment of the monoclonal antibody and chemotherapeutic agent he is developed shortness of breath and edema cardiomyopathy and appears to have heart failure but I think is cardiotoxicity.  He is no longer on the agents.  He has mild orthopnea exertional shortness of breath walking indoors and outdoors no chest pain palpitation or syncope.  He does sodium restrict but unfortunately took 1 dose of his loop diuretic and never took it afterwards.  I asked him to begin to weigh daily take his loop diuretic we will check a proBNP level and if elevated will place him on traditional treatment including beta-blocker and ARB.  The proBNP level is normal and he responds I would just keep him on a loop diuretic.  He has no history of congenital rheumatic heart disease.  Past Medical History:  Diagnosis  Date  . Atrial tachycardia (HCC)   . Basal cell carcinoma of face   . Cancer (HCC)   . Headache   . Multiple sclerosis (HCC)   . Vision abnormalities     Past Surgical History:  Procedure Laterality Date  . CARDIAC ELECTROPHYSIOLOGY MAPPING AND ABLATION    . SKIN CANCER EXCISION  2016   nose  . TONSILLECTOMY      Current Medications: Current Meds  Medication Sig  . Albuterol Sulfate 108 (90 Base) MCG/ACT AEPB Inhale 1-2 puffs into the lungs every 4 (four) hours as needed (sob, chest  tightness, wheezing, cough).  [name] amphetamine-dextroamphetamine (ADDERALL) 10 MG tablet Take 1 tablet (10 mg total) by mouth 2 (two) times daily with a meal.  . baclofen (LIORESAL) 10 MG tablet Take 1 tablet (10 mg total) by mouth 4 (four) times daily.  . cyclobenzaprine (FLEXERIL) 5 MG tablet One to two at bedtime (Patient taking differently: Take 5 mg by mouth 3 (three) times daily as needed. One to two at bedtime)  . doxepin (SINEQUAN) 10 MG capsule Take 1 capsule (10 mg total) by mouth at bedtime.  . gabapentin (NEURONTIN) 300 MG capsule Take 1 capsule (300 mg total) by mouth 3 (three) times daily.  . hydrOXYzine (ATARAX/VISTARIL) 10 MG tablet Take 1 tablet (10 mg total) by mouth 3 (three) times daily as needed.  [name] levocetirizine (XYZAL) 5 MG tablet Take 1 tablet (5 mg total) by mouth every evening.  [name] oxybutynin (DITROPAN) 5 MG tablet Take 1 tablet (5 mg total) by mouth 2 (two) times daily.  [name] triamcinolone cream (KENALOG) 0.5 % Apply 1 application topically 3 (three) times daily.     Allergies:   Campath [alemtuzumab]; Mavenclad [cladribine]; Avonex [interferon beta-1a]; Betaseron [interferon beta-1b]; and Tysabri [natalizumab]   Social History   Socioeconomic History  . Marital status: Married    Spouse name: Not on file  . Number of children: Not on file  . Years of education: Not on file  . Highest education level: Not on file  Occupational History  . Not on file  Social Needs  . Financial resource strain: Not on file  . Food insecurity:    Worry: Not on file    Inability: Not on file  . Transportation needs:    Medical: Not on file    Non-medical: Not on file  Tobacco Use  . Smoking status: Current Every Day Smoker    Packs/day: 0.75    Start date: 61  . Smokeless tobacco: Never Used  Substance and Sexual Activity  . Alcohol use: Yes    Alcohol/week: 0.0 standard drinks    Comment: occasional beer  . Drug use: No  . Sexual activity: Not on file  Lifestyle  .  Physical activity:    Days per week: Not on file    Minutes per session: Not on file  . Stress: Not on file  Relationships  . Social connections:    Talks on phone: Not on file    Gets together: Not on file    Attends religious service: Not on file    Active member of club or organization: Not on file    Attends meetings of clubs or organizations: Not on file    Relationship status: Not on file  Other Topics Concern  . Not on file  Social History Narrative  . Not on file     Family History: The patient's family history includes Healthy in his mother; Heart attack in  his father; Leukemia in his father; Stroke in his father. ROS:   Please see the history of present illness.    All other systems reviewed and are negative. Review of Systems  Constitution: Positive for malaise/fatigue.  HENT: Positive for hoarse voice.   Eyes: Negative.   Cardiovascular: Positive for dyspnea on exertion, leg swelling and orthopnea.  Respiratory: Positive for shortness of breath.   Endocrine: Negative.   Hematologic/Lymphatic: Negative.   Skin: Negative.   Musculoskeletal: Negative.   Gastrointestinal: Negative.   Genitourinary: Negative.   Neurological: Positive for weakness.  Psychiatric/Behavioral: Negative.   Allergic/Immunologic: Negative.    EKGs/Labs/Other Studies Reviewed:    The following studies were reviewed today:  EKG:  EKG ordered today.  The ekg ordered today demonstrates sinus rhythm normal I personally reviewed  Recent Labs: 08/01/2018: ALT 20; BUN 8; Creatinine, Ser 1.00; Hemoglobin 13.2; Platelets 159; Potassium 3.8; Sodium 139  Recent Lipid Panel No results found for: CHOL, TRIG, HDL, CHOLHDL, VLDL, LDLCALC, LDLDIRECT  Physical Exam:    VS:  BP 130/82   Pulse 69   Ht 6\' 1"  (1.854 m)   Wt 202 lb 1.9 oz (91.7 kg)   SpO2 97%   BMI 26.67 kg/m     Wt Readings from Last 3 Encounters:  08/30/18 202 lb 1.9 oz (91.7 kg)  08/21/18 205 lb 6 oz (93.2 kg)  08/01/18 205  lb 8 oz (93.2 kg)     GEN:  Well nourished, well developed in no acute distress HEENT: Normal NECK: No JVD; No carotid bruits LYMPHATICS: No lymphadenopathy CARDIAC: RRR, no murmurs, rubs, gallops RESPIRATORY:  Clear to auscultation without rales, wheezing or rhonchi  ABDOMEN: Soft, non-tender, non-distended MUSCULOSKELETAL: 1-2+ lower extremity edema to the knee with marked varicosities bilateral edema; No deformity  SKIN: Warm and dry NEUROLOGIC:  Alert and oriented x 3 PSYCHIATRIC:  Normal affect    Signed, Norman Herrlich, MD  08/30/2018 3:14 PM    [facility]

## 2018-08-30 ENCOUNTER — Ambulatory Visit: Payer: Medicare Other | Admitting: Cardiology

## 2018-08-30 ENCOUNTER — Encounter: Payer: Self-pay | Admitting: Cardiology

## 2018-08-30 VITALS — BP 130/82 | HR 69 | Ht 73.0 in | Wt 202.1 lb

## 2018-08-30 DIAGNOSIS — I427 Cardiomyopathy due to drug and external agent: Secondary | ICD-10-CM

## 2018-08-30 DIAGNOSIS — R600 Localized edema: Secondary | ICD-10-CM | POA: Diagnosis not present

## 2018-08-30 DIAGNOSIS — R931 Abnormal findings on diagnostic imaging of heart and coronary circulation: Secondary | ICD-10-CM | POA: Insufficient documentation

## 2018-08-30 DIAGNOSIS — I429 Cardiomyopathy, unspecified: Secondary | ICD-10-CM | POA: Insufficient documentation

## 2018-08-30 MED ORDER — FUROSEMIDE 20 MG PO TABS: 20.0000 mg | ORAL_TABLET | Freq: Every day | ORAL | 0 refills | Status: DC

## 2018-08-30 NOTE — Patient Instructions (Addendum)
Medication Instructions:  Your physician has recommended you make the following change in your medication:   START taking furosemide 20 mg (1 tablet) daily for 30 days  If you need a refill on your cardiac medications before your next appointment, please call your pharmacy.   Lab work: Your physician recommends that you have a ProBNP and BMP done today.  If you have labs (blood work) drawn today and your tests are completely normal, you will receive your results only by: [name] MyChart Message (if you have MyChart) OR . A paper copy in the mail If you have any lab test that is abnormal or we need to change your treatment, we will call you to review the results.  Testing/Procedures: An EKG was performed today.   Follow-Up: At [facility], you and your health needs are our priority.  As part of our continuing mission to provide you with exceptional heart care, we have created designated Provider Care Teams.  These Care Teams include your primary Cardiologist (physician) and Advanced Practice Providers (APPs -  Physician Assistants and Nurse Practitioners) who all work together to provide you with the care you need, when you need it. You will need a follow up appointment in 4 weeks.    Any Other Special Instructions Will Be Listed Below   Locations to Purchase Compression Stockings:    I recommend knee high medium support  [facility]  Or [address] on [address] [place],  [address] [phone] (fax) [email or web address] *Mon-Fri 9am-4:30pm* *Closed on Holidays*  Putting on Compression Stockings Turn the stocking inside-out, then fit it over your toes and heel.  Roll the stocking up your leg.  Once stockings are on, make sure the top of the stocking is about two fingers' width below the crease of the knee (or the groin if you wear thigh-high stockings).  Use equipment, such as a stocking donner,  or wear rubber gloves to make it easier to put on compression stockings.   Elastic compression stockings are prescribed to treat many vein problems. Wearing them may be the most important thing you do to manage your symptoms. The stockings fit tightly aroundyour ankle, gradually reducing in pressure as they go up your legs. This helps keep blood flowing toyour heart. As a result, swelling is reduced. Your doctor will prescribe stockings at a safe pressure for you. He or she will also tell you how often to wear and remove the stockings. Follow these instructions closely.Also, do not buy or wear compression stockings without first seeing your doctor. Tips for Wear and Care To wear stockings safely and to get the most benefit:  Wear the length prescribed by your doctor.  Pull them to the designated height and no farther. Don't let them bunch at the top. This can restrict blood flow and increase swelling.  Wear the stockingsfor the amount of time your doctor recommends. Replace them when they start to feel loose. This will likely be every 3 to 6 months.  Remove them as your doctor directs. When removed, wash your legs. Then check your legs and feet for sores. Call your doctor if you find a sore. Don't put the stockings back on unless your doctor directs.  Wash the stockings as instructed. They may need to be handwashed.   Furosemide tablets What is this medicine? FUROSEMIDE (fyoor [place] se mide) is a diuretic. It helps you make more urine and to lose salt and  excess water from your body. This medicine is used to treat high blood pressure, and edema or swelling from heart, kidney, or liver disease. This medicine may be used for other purposes; ask your health care provider or pharmacist if you have questions. COMMON BRAND NAME(S): Active-Medicated Specimen Kit, Delone, Diuscreen, Lasix, RX Specimen Collection Kit, Specimen Collection Kit, URINX Medicated Specimen Collection What should I tell my  health care provider before I take this medicine? They need to know if you have any of these conditions: -abnormal blood electrolytes -diarrhea or vomiting -gout -heart disease -kidney disease, small amounts of urine, or difficulty passing urine -liver disease -thyroid disease -an unusual or allergic reaction to furosemide, sulfa drugs, other medicines, foods, dyes, or preservatives -pregnant or trying to get pregnant -breast-feeding How should I use this medicine? Take this medicine by mouth with a glass of water. Follow the directions on the prescription label. You may take this medicine with or without food. If it upsets your stomach, take it with food or milk. Do not take your medicine more often than directed. Remember that you will need to pass more urine after taking this medicine. Do not take your medicine at a time of day that will cause you problems. Do not take at bedtime. Talk to your pediatrician regarding the use of this medicine in children. While this drug may be prescribed for selected conditions, precautions do apply. Overdosage: If you think you have taken too much of this medicine contact a poison control center or emergency room at once. NOTE: This medicine is only for you. Do not share this medicine with others. What if I miss a dose? If you miss a dose, take it as soon as you can. If it is almost time for your next dose, take only that dose. Do not take double or extra doses. What may interact with this medicine? -aspirin and aspirin-like medicines -certain antibiotics -chloral hydrate -cisplatin -cyclosporine -digoxin -diuretics -laxatives -lithium -medicines for blood pressure -medicines that relax muscles for surgery -methotrexate -NSAIDs, medicines for pain and inflammation like ibuprofen, naproxen, or indomethacin -phenytoin -steroid medicines like prednisone or cortisone -sucralfate -thyroid hormones This list may not describe all possible  interactions. Give your health care provider a list of all the medicines, herbs, non-prescription drugs, or dietary supplements you use. Also tell them if you smoke, drink alcohol, or use illegal drugs. Some items may interact with your medicine. What should I watch for while using this medicine? Visit your doctor or health care professional for regular checks on your progress. Check your blood pressure regularly. Ask your doctor or health care professional what your blood pressure should be, and when you should contact him or her. If you are a diabetic, check your blood sugar as directed. You may need to be on a special diet while taking this medicine. Check with your doctor. Also, ask how many glasses of fluid you need to drink a day. You must not get dehydrated. You may get drowsy or dizzy. Do not drive, use machinery, or do anything that needs mental alertness until you know how this drug affects you. Do not stand or sit up quickly, especially if you are an older patient. This reduces the risk of dizzy or fainting spells. Alcohol can make you more drowsy and dizzy. Avoid alcoholic drinks. This medicine can make you more sensitive to the sun. Keep out of the sun. If you cannot avoid being in the sun, wear protective clothing and use sunscreen. Do not  use sun lamps or tanning beds/booths. What side effects may I notice from receiving this medicine? Side effects that you should report to your doctor or health care professional as soon as possible: -blood in urine or stools -dry mouth -fever or chills -hearing loss or ringing in the ears -irregular heartbeat -muscle pain or weakness, cramps -skin rash -stomach upset, pain, or nausea -tingling or numbness in the hands or feet -unusually weak or tired -vomiting or diarrhea -yellowing of the eyes or skin Side effects that usually do not require medical attention (report to your doctor or health care professional if they continue or are  bothersome): -headache -loss of appetite -unusual bleeding or bruising This list may not describe all possible side effects. Call your doctor for medical advice about side effects. You may report side effects to FDA at 1-800-FDA-1088. Where should I keep my medicine? Keep out of the reach of children. Store at room temperature between 15 and 30 degrees C (59 and 86 degrees F). Protect from light. Throw away any unused medicine after the expiration date. NOTE: This sheet is a summary. It may not cover all possible information. If you have questions about this medicine, talk to your doctor, pharmacist, or health care provider.  2019 Elsevier/Gold Standard (2014-09-11 13:49:50)

## 2018-08-31 LAB — BASIC METABOLIC PANEL
BUN/Creatinine Ratio: 13 (ref 9–20)
BUN: 10 mg/dL (ref 6–24)
CO2: 24 mmol/L (ref 20–29)
CREATININE: 0.76 mg/dL (ref 0.76–1.27)
Calcium: 9.1 mg/dL (ref 8.7–10.2)
Chloride: 103 mmol/L (ref 96–106)
GFR calc non Af Amer: 101 mL/min/{1.73_m2} (ref >59)
GFR, EST AFRICAN AMERICAN: 117 mL/min/{1.73_m2} (ref >59)
GLUCOSE: 91 mg/dL (ref 65–99)
Potassium: 3.8 mmol/L (ref 3.5–5.2)
SODIUM: 141 mmol/L (ref 134–144)

## 2018-08-31 LAB — PRO B NATRIURETIC PEPTIDE: NT-PRO BNP: 159 pg/mL (ref 0–210)

## 2018-09-15 ENCOUNTER — Other Ambulatory Visit: Payer: Self-pay | Admitting: Neurology

## 2018-09-21 ENCOUNTER — Telehealth: Payer: Self-pay | Admitting: *Deleted

## 2018-09-21 DIAGNOSIS — Z0289 Encounter for other administrative examinations: Secondary | ICD-10-CM

## 2018-09-21 NOTE — Telephone Encounter (Signed)
Gave completed/signed [facility] financial-pysician statement form back to medical records to process for pt.

## 2018-09-22 ENCOUNTER — Telehealth: Payer: Self-pay | Admitting: *Deleted

## 2018-09-22 HISTORY — PX: TUMOR REMOVAL: SHX12

## 2018-09-22 NOTE — Telephone Encounter (Signed)
I faxed pt form to [place] on 09/22/18

## 2018-10-02 ENCOUNTER — Encounter: Payer: Self-pay | Admitting: Family Medicine

## 2018-10-02 ENCOUNTER — Ambulatory Visit (INDEPENDENT_AMBULATORY_CARE_PROVIDER_SITE_OTHER): Payer: Medicare Other | Admitting: Family Medicine

## 2018-10-02 ENCOUNTER — Telehealth: Payer: Self-pay | Admitting: Cardiology

## 2018-10-02 DIAGNOSIS — J439 Emphysema, unspecified: Secondary | ICD-10-CM

## 2018-10-02 MED ORDER — FLUTICASONE PROPIONATE HFA 110 MCG/ACT IN AERO: 2.0000 | INHALATION_SPRAY | Freq: Two times a day (BID) | RESPIRATORY_TRACT | 2 refills | Status: DC

## 2018-10-02 NOTE — Telephone Encounter (Signed)
New Message   Pt is calling because he said he was supposed to have an Appt April 4th, he said he has an appointment card that the receptionist gave to him. He noticed the date is for Saturday so he is calling to see when he is really scheduled. I advised him their was no scheduled appt  He said he is suppose to have a follow up and would like for a nurse to call him back

## 2018-10-02 NOTE — Progress Notes (Signed)
Virtual Visit via Video Note  I connected with Madelin Headings on 10/02/18 at  1:15 PM EDT by a video enabled telemedicine application and verified that I am speaking with the correct person using two identifiers.   I discussed the limitations of evaluation and management by telemedicine and the availability of in person appointments. The patient expressed understanding and agreed to proceed.  History of Present Illness: Pt recently dx'd with laryngeal cancer. Had surg, radiation starting next week. Lasix did well with sob, not SABA. Unsure if pred helped with breathing, but it did his back. In pain from post op status.    Observations/Objective: No conversational dyspnea Age appropriate judgment and insight Nml affect and mood +hoarseness  Assessment and Plan: Pulmonary emphysema, unspecified emphysema type ([place]) - Plan: fluticasone (FLOVENT HFA) 110 MCG/ACT inhaler  Rinse mouth out after each use. OK to wait for appt with Dr. Bettina Gavia to see if it is more fluid overload related. If no improvement and heart is taken care of, would refer to pulm.  Air humidifier for throat comfort.  Follow Up Instructions: F/u in 6 weeks after starting ICS.    I discussed the assessment and treatment plan with the patient. The patient was provided an opportunity to ask questions and all were answered. The patient agreed with the plan and demonstrated an understanding of the instructions.   The patient was advised to call back or seek an in-person evaluation if the symptoms worsen or if the condition fails to improve as anticipated.   [place], DO

## 2018-10-03 ENCOUNTER — Other Ambulatory Visit: Payer: Self-pay

## 2018-10-03 ENCOUNTER — Telehealth (INDEPENDENT_AMBULATORY_CARE_PROVIDER_SITE_OTHER): Payer: Medicare Other | Admitting: Cardiology

## 2018-10-03 ENCOUNTER — Encounter: Payer: Self-pay | Admitting: Cardiology

## 2018-10-03 VITALS — BP 135/88 | Wt 198.0 lb

## 2018-10-03 DIAGNOSIS — C329 Malignant neoplasm of larynx, unspecified: Secondary | ICD-10-CM | POA: Diagnosis not present

## 2018-10-03 DIAGNOSIS — I427 Cardiomyopathy due to drug and external agent: Secondary | ICD-10-CM | POA: Diagnosis not present

## 2018-10-03 DIAGNOSIS — J439 Emphysema, unspecified: Secondary | ICD-10-CM | POA: Diagnosis not present

## 2018-10-03 DIAGNOSIS — R600 Localized edema: Secondary | ICD-10-CM

## 2018-10-03 DIAGNOSIS — I5042 Chronic combined systolic (congestive) and diastolic (congestive) heart failure: Secondary | ICD-10-CM | POA: Diagnosis not present

## 2018-10-03 MED ORDER — FUROSEMIDE 40 MG PO TABS: 40.0000 mg | ORAL_TABLET | Freq: Every day | ORAL | 1 refills | Status: AC

## 2018-10-03 NOTE — Telephone Encounter (Signed)
   Cardiac Questionnaire:    Since your last visit or hospitalization:    1. Have you been having new or worsening chest pain? Yes   2. Have you been having new or worsening shortness of breath? Yes 3. Have you been having new or worsening leg swelling, wt gain, or increase in abdominal girth (pants fitting more tightly)? No   4. Have you had any passing out spells? No    Patient will have a Webex ev-visit today at 3:40 pm with Dr. Bettina Gavia.   COVID-19 Pre-Screening Questions:  . Do you currently have a fever? No . Have you recently travelled on a cruise, internationally, or to [place], [place], [place], [place], [place], or [place], [place] [employer]) ? No . Have you been in contact with someone that is currently pending confirmation of Covid19 testing or has been confirmed to have the [place] virus?  No . Are you currently experiencing fatigue or cough? No

## 2018-10-03 NOTE — Progress Notes (Signed)
Virtual Visit via Video Note    Evaluation Performed:  Follow-up visit  This visit type was conducted due to national recommendations for restrictions regarding the COVID-19 Pandemic (e.g. social distancing).  This format is felt to be most appropriate for this patient at this time.  All issues noted in this document were discussed and addressed.  No physical exam was performed (except for noted visual exam findings with Video Visits).  Please refer to the patient's chart (MyChart message for video visits and phone note for telephone visits) for the patient's consent to telehealth for [facility].  Date:  10/03/2018   ID:  Don Hamilton, DOB Aug 24, 1960, MRN [id number]  Patient Location:  He is at home  Provider location:   [facility]  PCP:  Sharlene Dory, DO  Cardiologist:  No primary care provider on file.  Dr. Dulce Sellar Electrophysiologist:  None   Chief Complaint: Follow-up for cardiomyopathy he was seen by his PCP yesterday and concerns that his shortness of breath may be more than pulmonary in etiology  History of Present Illness:    Don Hamilton is a 58 y.o. male who presents via audio/video conferencing for a telehealth visit today.  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  After his last visit I started him on low-dose of a diuretic.  The patient does not have symptoms concerning for COVID-19 infection (fever, chills, cough, or new shortness of breath).   Don Hamilton is a 58 y.o. male with a hx of an abnormal echo with mildly reduced EF of 45-50%  for edema referred by Dr Epimenio Foot.He is taking gabapentin which favor sodium retention and edema. He has a history of SVT and ablation.   Don Hamilton is not doing well he is struggling since he had a vocal cord biopsy last week he is hoarse he has a cough nonproductive and he is wheezing.  Initially when I placed him on a loop diuretic small dose his peripheral edema and shortness of  breath improved.  He continued to take his diuretic despite the fact that he has no edema he is increasingly short of breath mild orthopnea but not having PND.  He has had no fever or chills and no sputum production he was prescribed an additional bronchodilator but is awaiting delivery from the pharmacy.  No chest pain palpitation or syncope.  He had biopsy last week and will initiate radiation therapy tomorrow.  I will send a note to radiation therapy asking that they draw labs tomorrow including BMP and a proBNP with a copy to me.  We will get a continue his diuretic increased from 20 to 40 mg/day realizing with his radiation and difficulty swallowing he will be having a diet which should include soup soft and Ensure supplements that can be high in sodium.   Prior CV studies:   The following studies were reviewed today:  Ref Range & Units72mo ago NT-Pro BNP0 - 210 [id number]   08/11/18:   FINDINGS  Left Ventricle: The left ventricle has mildly reduced systolic function of 45-50%. The cavity size was normal. There is concentric left ventricular hypertrophy. Echo evidence of impaired diastolic relaxation Normal left ventricular filling pressures No  evidence of left ventricular regional wall motion abnormalities.. Right Ventricle: The right ventricle has normal systolic function. The cavity was normal. There is no increase in right ventricular wall thickness. Left Atrium: left atrial size was normal in size Right Atrium: right atrial  size was normal in size Interatrial Septum: No atrial level shunt detected by color flow Doppler.  CXR 05/29/18:   FINDINGS: Cardiac shadow is within normal limits. The lungs are hyperinflated. No focal infiltrate or sizable effusion is seen. No acute bony abnormality is noted. IMPRESSION: COPD without acute abnormality.   Past Medical History:  Diagnosis Date  . Atrial tachycardia (HCC)   . Basal cell carcinoma of face   . Cancer (HCC)   . Headache   .  Multiple sclerosis (HCC)   . Vision abnormalities    Past Surgical History:  Procedure Laterality Date  . CARDIAC ELECTROPHYSIOLOGY MAPPING AND ABLATION    . SKIN CANCER EXCISION  2016   nose  . TONSILLECTOMY    . 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 amphetamine-dextroamphetamine (ADDERALL) 10 MG tablet Take 1 tablet (10 mg total) by mouth 2 (two) times daily with a meal.  . baclofen (LIORESAL) 10 MG tablet Take 1 tablet (10 mg total) by mouth 4 (four) times daily.  . cyclobenzaprine (FLEXERIL) 5 MG tablet One to two at bedtime (Patient taking differently: Take 5 mg by mouth 3 (three) times daily as needed. One to two at bedtime)  . doxepin (SINEQUAN) 10 MG capsule Take 1 capsule (10 mg total) by mouth at bedtime.  . furosemide (LASIX) 40 MG tablet Take 1 tablet (40 mg total) by mouth daily.  [name] gabapentin (NEURONTIN) 300 MG capsule TAKE 1 CAPSULE BY MOUTH THREE TIMES A DAY  . hydrOXYzine (ATARAX/VISTARIL) 10 MG tablet Take 1 tablet (10 mg total) by mouth 3 (three) times daily as needed.  [name] oxybutynin (DITROPAN) 5 MG tablet Take 1 tablet (5 mg total) by mouth 2 (two) times daily.  [name] triamcinolone cream (KENALOG) 0.5 % Apply 1 application topically 3 (three) times daily.  . [DISCONTINUED] furosemide (LASIX) 20 MG tablet Take 1 tablet (20 mg total) by mouth daily.     Allergies:   Campath [alemtuzumab]; Mavenclad [cladribine]; Avonex [interferon beta-1a]; Betaseron [interferon beta-1b]; and Tysabri [natalizumab]   Social History   Tobacco Use  . Smoking status: Current Every Day Smoker    Packs/day: 0.75    Start date: 61  . Smokeless tobacco: Never Used  Substance Use Topics  . Alcohol use: Yes    Alcohol/week: 0.0 standard drinks    Comment: occasional beer  . Drug use: No     Family Hx: The patient's family history includes Healthy in his mother; Heart attack in his father; Leukemia in his father; Stroke  in his father.  ROS:   Please see the history of present illness.     All other systems reviewed and are negative.   Labs/Other Tests and Data Reviewed:    Recent Labs: 08/01/2018: ALT 20; Hemoglobin 13.2; Platelets 159 08/30/2018: BUN 10; Creatinine, Ser 0.76; NT-Pro BNP 159; Potassium 3.8; Sodium 141   Recent Lipid Panel No results found for: CHOL, TRIG, HDL, CHOLHDL, LDLCALC, LDLDIRECT  Wt Readings from Last 3 Encounters:  10/03/18 198 lb (89.8 kg)  08/30/18 202 lb 1.9 oz (91.7 kg)  08/21/18 205 lb 6 oz (93.2 kg)     Objective:    Vital Signs:  BP 135/88   Wt 198 lb (89.8 kg)   BMI 26.12 kg/m    He appears weak somewhat chronically ill but male in no acute distress. He has mild neck vein distention no edema no respiratory  distress he has a very hoarse voice and does not have stridor.  ASSESSMENT & PLAN:    1.  He recently presented with mildly reduced ejection fraction and decompensated heart failure.  Initially responded to oral diuretic now he has worsening shortness of breath but I suspect is a combination related to his laryngeal cancer COPD with exacerbation and decompensated heart failure.  He will continue his loop diuretic furosemide but increase to 40 mg/day I will ask radiation therapy to draw a BMP and proBNP level and I will plan to follow-up with him 2 weeks virtual  regarding response. 2.  Laryngeal cancer to initiate radiation therapy 3.  Cardiomyopathy, at this time with decompensated COPD I would not start a beta-blocker and I would prefer to wait until I can physically seen in the office before initiating things like ARB or Entresto as I would not want to induce hypotension interfering with treatment of his cancer 4.  COPD worsened managed by his PCP I encouraged him to fill the prescription and to be compliant  COVID-19 Education: The signs and symptoms of COVID-19 were discussed with the patient and how to seek care for testing (follow up with PCP or  arrange E-visit).  the importance of social distancing was discussed today.  Patient Risk:   After full review of this patient's clinical status, I feel that they are at least moderate risk at this time.  Time:   Today, I have spent 30 minutes with the patient with telehealth technology discussing treatment of his decompensated heart failure in the setting of new laryngeal cancer and decompensated COPD.  More than half of the time was spent in education regarding heart failure treatment and developing a plan for treating his decompensated heart failure avoiding exposure to coronavirus..     Medication Adjustments/Labs and Tests Ordered: Current medicines are reviewed at length with the patient today.  Concerns regarding medicines are outlined above.  Tests Ordered: No orders of the defined types were placed in this encounter.  Medication Changes: Meds ordered this encounter  Medications  . furosemide (LASIX) 40 MG tablet    Sig: Take 1 tablet (40 mg total) by mouth daily.    Dispense:  90 tablet    Refill:  1    Disposition:  Follow up in 2 week(s)  Signed, Norman Herrlich, MD  10/03/2018 4:12 PM    [facility]

## 2018-10-03 NOTE — Patient Instructions (Addendum)
Medication Instructions: Your physician has recommended you make the following change in your medication:   INCREASE furosemide (lasix) 40 mg: Take 1 tablet daily   If you need a refill on your cardiac medications before your next appointment, please call your pharmacy.   Lab work: Your physician recommends that you return for lab work tomorrow, 10/04/2018: BMP, ProBNP.   If you have labs (blood work) drawn today and your tests are completely normal, you will receive your results only by: [name] MyChart Message (if you have MyChart) OR . A paper copy in the mail If you have any lab test that is abnormal or we need to change your treatment, we will call you to review the results.  Testing/Procedures: None  Follow-Up: At [facility], you and your health needs are our priority.  As part of our continuing mission to provide you with exceptional heart care, we have created designated Provider Care Teams.  These Care Teams include your primary Cardiologist (physician) and Advanced Practice Providers (APPs -  Physician Assistants and Nurse Practitioners) who all work together to provide you with the care you need, when you need it. You will need a follow up appointment in 2 weeks via Webex.

## 2018-10-03 NOTE — Telephone Encounter (Signed)
Patient has been scheduled for a Webex virtual visit today at 3:40 pm with Dr. Bettina Gavia for a 1 month follow up and due to worsened chest pain and shortness of breath. Patient informed staff will contact him 15 minutes before his scheduled appointment. Patient is agreeable and verbalized understanding.

## 2018-10-04 ENCOUNTER — Telehealth: Payer: Self-pay | Admitting: Neurology

## 2018-10-04 NOTE — Addendum Note (Signed)
Addended by: Austin Miles on: 10/04/2018 02:35 PM   Modules accepted: Orders

## 2018-10-04 NOTE — Telephone Encounter (Signed)
Pt has called to inform that since his last chemo he has had a pain behind his left eye.  Pt states for the last 2 weeks it has been constant.  Pt is asking for a call to discuss.

## 2018-10-04 NOTE — Telephone Encounter (Signed)
Dr. Sater- please advise 

## 2018-10-05 ENCOUNTER — Telehealth: Payer: Self-pay

## 2018-10-05 ENCOUNTER — Telehealth: Payer: Self-pay | Admitting: Neurology

## 2018-10-05 DIAGNOSIS — C760 Malignant neoplasm of head, face and neck: Secondary | ICD-10-CM

## 2018-10-05 DIAGNOSIS — R51 Headache: Secondary | ICD-10-CM

## 2018-10-05 DIAGNOSIS — G35 Multiple sclerosis: Secondary | ICD-10-CM

## 2018-10-05 DIAGNOSIS — R519 Headache, unspecified: Secondary | ICD-10-CM

## 2018-10-05 LAB — BASIC METABOLIC PANEL
BUN/Creatinine Ratio: 13 (ref 9–20)
BUN: 12 mg/dL (ref 6–24)
CO2: 22 mmol/L (ref 20–29)
Calcium: 9 mg/dL (ref 8.7–10.2)
Chloride: 99 mmol/L (ref 96–106)
Creatinine, Ser: 0.93 mg/dL (ref 0.76–1.27)
GFR calc Af Amer: 105 mL/min/{1.73_m2} (ref >59)
GFR calc non Af Amer: 91 mL/min/{1.73_m2} (ref >59)
Glucose: 94 mg/dL (ref 65–99)
Potassium: 3.9 mmol/L (ref 3.5–5.2)
Sodium: 137 mmol/L (ref 134–144)

## 2018-10-05 LAB — PRO B NATRIURETIC PEPTIDE: NT-Pro BNP: 63 pg/mL (ref 0–210)

## 2018-10-05 MED ORDER — OXYCODONE HCL 7.5 MG PO TABS: ORAL_TABLET | ORAL | 0 refills | Status: DC

## 2018-10-05 MED ORDER — POTASSIUM CHLORIDE ER 10 MEQ PO TBCR: 10.0000 meq | EXTENDED_RELEASE_TABLET | Freq: Every day | ORAL | 2 refills | Status: DC

## 2018-10-05 NOTE — Telephone Encounter (Signed)
He was noting his voice was getting more hoarse and he was evaluated by Dr. Laurance Flatten, ENT.  He had a biopsy of the left vocal cord.   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  Over the last 3 weeks he is also noted more head pain behind the left eye.  This feels similar to the pain he had when he had optic neuritis but he has not noted any visual changes.  Due to the pain and his recent diagnosis of cancer, we will request an MRI

## 2018-10-05 NOTE — Telephone Encounter (Signed)
Spoke with patient regarding lab results.  Patient advised per Dr Bettina Gavia to start potassium (klor con) 10 meq one tablet daily.  Rx sent to pharmacy as requested.  Patient agreed to plan and verbalized understanding.

## 2018-10-05 NOTE — Telephone Encounter (Signed)
Left message for patient to return call for lab results. 

## 2018-10-06 ENCOUNTER — Telehealth: Payer: Self-pay | Admitting: Neurology

## 2018-10-06 NOTE — Telephone Encounter (Signed)
Pt said oxyCODONE HCl 7.5 MG TABA was sent in for brand name, it cost $1200 and he can't afford that. He is wanting to know if generic could be sent. I advised him the clinic was closed on Friday's due to covid-19 precautions and Dr Felecia Shelling is not on call and narcotics are not prescribed over the weekend. Pt asked if a message could be sent to on call provider to discuss.

## 2018-10-06 NOTE — Telephone Encounter (Signed)
Chart reviewed,oxycodone Rx 7.5mg  90 tabs was sent in on October 05 2018, no indication it was brand,   Please check registry and pharmacy.

## 2018-10-09 ENCOUNTER — Telehealth: Payer: Self-pay | Admitting: Neurology

## 2018-10-09 ENCOUNTER — Other Ambulatory Visit: Payer: Self-pay | Admitting: Neurology

## 2018-10-09 MED ORDER — OXYCODONE-ACETAMINOPHEN 10-325 MG PO TABS: 1.0000 | ORAL_TABLET | Freq: Three times a day (TID) | ORAL | 0 refills | Status: DC | PRN

## 2018-10-09 NOTE — Telephone Encounter (Signed)
Noted  

## 2018-10-09 NOTE — Telephone Encounter (Signed)
Dr. Felecia Shelling- what would you like to do? I do not see where brand was checked or that this was previously prescribed for him

## 2018-10-09 NOTE — Telephone Encounter (Signed)
[employer] Medicare no auth. Patient is scheduled at [facility] for Thurs. 11/09/18 arrival time is 10:30.AM I left a lvm for the patient to be aware of this. I also left their number of [phone] incase he needed to r/s for any reason.

## 2018-10-09 NOTE — Telephone Encounter (Signed)
Sent in a different prescription that should be cheaper --- I was unaware that 7.5 mg not a generic dose

## 2018-10-11 ENCOUNTER — Telehealth: Payer: Self-pay | Admitting: Cardiology

## 2018-10-11 NOTE — Telephone Encounter (Signed)
None

## 2018-10-16 DIAGNOSIS — I5032 Chronic diastolic (congestive) heart failure: Secondary | ICD-10-CM | POA: Insufficient documentation

## 2018-10-17 ENCOUNTER — Other Ambulatory Visit: Payer: Self-pay

## 2018-10-17 ENCOUNTER — Telehealth (INDEPENDENT_AMBULATORY_CARE_PROVIDER_SITE_OTHER): Payer: Medicare Other | Admitting: Cardiology

## 2018-10-17 ENCOUNTER — Encounter: Payer: Self-pay | Admitting: Cardiology

## 2018-10-17 VITALS — Ht 73.0 in | Wt 198.0 lb

## 2018-10-17 DIAGNOSIS — C329 Malignant neoplasm of larynx, unspecified: Secondary | ICD-10-CM

## 2018-10-17 DIAGNOSIS — I5032 Chronic diastolic (congestive) heart failure: Secondary | ICD-10-CM

## 2018-10-17 NOTE — Progress Notes (Signed)
Virtual Visit via Video Note   This visit type was conducted due to national recommendations for restrictions regarding the COVID-19 Pandemic (e.g. social distancing) in an effort to limit this patient's exposure and mitigate transmission in our community.  Due to his co-morbid illnesses, this patient is at least at moderate risk for complications without adequate follow up.  This format is felt to be most appropriate for this patient at this time.  All issues noted in this document were discussed and addressed.  A limited physical exam was performed with this format.  Please refer to the patient's chart for his consent to telehealth for [facility].   Evaluation Performed:  Follow-up visit  Date:  10/17/2018   ID:  Don Hamilton, DOB [birth year], MRN [id number]  Patient Location: Home  Provider Location: Home  PCP:  Sharlene Dory, DO  Cardiologist:  No primary care provider on file. Dr Dulce Sellar Electrophysiologist:  None   Chief Complaint:  Heart failure  History of Present Illness:    Don Hamilton is a 58 y.o. male who presents via audio/video conferencing for a telehealth visit today.    Don Hamilton is a 58 y.o. male with a hx of an abnormal echo and cardiomyopathy  with mildly reduced EF of 45-50% and heart failure.  His last tele-visit 10/03/2018 for increasing shortness of breath with decompensated COPD and heart failure.  Last virtual visit was for increasing shortness of breath and edema his diuretic dose was increased.  He is struggling with a new diagnosis of laryngeal cancer undergoing radiation therapy at [facility]  The patient does not have symptoms concerning for COVID-19 infection (fever, chills, cough, or new shortness of breath).   He is clearly improved smiling optimistic that he will get through his radiation and conquer his cancer.  Since we increased his diuretic he is better he has no edema weight is stable has no edema orthopnea  shortness of breath palpitation or syncope.  Past Medical History:  Diagnosis Date  . Atrial tachycardia (HCC)   . Basal cell carcinoma of face   . Cancer (HCC)   . Headache   . Multiple sclerosis (HCC)   . Vision abnormalities    Past Surgical History:  Procedure Laterality Date  . CARDIAC ELECTROPHYSIOLOGY MAPPING AND ABLATION    . SKIN CANCER EXCISION  2016   nose  . TONSILLECTOMY    . 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 amphetamine-dextroamphetamine (ADDERALL) 10 MG tablet Take 1 tablet (10 mg total) by mouth 2 (two) times daily with a meal.  . baclofen (LIORESAL) 10 MG tablet Take 1 tablet (10 mg total) by mouth 4 (four) times daily.  . cyclobenzaprine (FLEXERIL) 5 MG tablet Take 5 mg by mouth 3 (three) times daily as needed for muscle spasms.  [name] doxepin (SINEQUAN) 10 MG capsule Take 1 capsule (10 mg total) by mouth at bedtime.  . fluticasone (FLOVENT HFA) 110 MCG/ACT inhaler Inhale 2 puffs into the lungs 2 (two) times daily. Rinse mouth out after each use.  . furosemide (LASIX) 40 MG tablet Take 1 tablet (40 mg total) by mouth daily.  [name] gabapentin (NEURONTIN) 300 MG capsule TAKE 1 CAPSULE BY MOUTH THREE TIMES A DAY  . hydrOXYzine (ATARAX/VISTARIL) 10 MG tablet Take 1 tablet (10 mg total) by mouth 3 (three) times daily as needed.  [name] oxybutynin (DITROPAN) 5 MG  tablet Take 1 tablet (5 mg total) by mouth 2 (two) times daily.  [name] oxyCODONE-acetaminophen (PERCOCET) 10-325 MG tablet Take 1 tablet by mouth every 8 (eight) hours as needed for pain.  . potassium chloride (KLOR-CON 10) 10 MEQ tablet Take 1 tablet (10 mEq total) by mouth daily.  [name] triamcinolone cream (KENALOG) 0.5 % Apply 1 application topically 3 (three) times daily.     Allergies:   Campath [alemtuzumab]; Mavenclad [cladribine]; Avonex [interferon beta-1a]; Betaseron [interferon beta-1b]; and Tysabri [natalizumab]   Social History   Tobacco Use  .  Smoking status: Current Every Day Smoker    Packs/day: 0.50    Types: Cigarettes    Start date: 62  . Smokeless tobacco: Never Used  Substance Use Topics  . Alcohol use: Yes    Alcohol/week: 0.0 standard drinks    Comment: occasional beer  . Drug use: No     Family Hx: The patient's family history includes Healthy in his mother; Heart attack in his father; Leukemia in his father; Stroke in his father.  ROS:   Please see the history of present illness.     All other systems reviewed and are negative.   Prior CV studies:   The following studies were reviewed today:  Ref Range & Units18mo ago NT-Pro BNP0 - 210 [id number]   08/11/18:   FINDINGS  Left Ventricle: The left ventricle has mildly reduced systolic function of 45-50%. The cavity size was normal. There is concentric left ventricular hypertrophy. Echo evidence of impaired diastolic relaxation Normal left ventricular filling pressures No  evidence of left ventricular regional wall motion abnormalities.. Right Ventricle: The right ventricle has normal systolic function. The cavity was normal. There is no increase in right ventricular wall thickness. Left Atrium: left atrial size was normal in size Right Atrium: right atrial size was normal in size Interatrial Septum: No atrial level shunt detected by color flow Doppler.   Labs/Other Tests and Data Reviewed:    EKG:  No ECG reviewed.  Recent Labs: 08/01/2018: ALT 20; Hemoglobin 13.2; Platelets 159 10/04/2018: BUN 12; Creatinine, Ser 0.93; NT-Pro BNP 63; Potassium 3.9; Sodium 137   Recent Lipid Panel No results found for: CHOL, TRIG, HDL, CHOLHDL, LDLCALC, LDLDIRECT  Wt Readings from Last 3 Encounters:  10/17/18 198 lb (89.8 kg)  10/03/18 198 lb (89.8 kg)  08/30/18 202 lb 1.9 oz (91.7 kg)     Objective:    Vital Signs:  Ht 6\' 1"  (1.854 m)   Wt 198 lb (89.8 kg)   BMI 26.12 kg/m    Constitutional, well-nourished well-developed in no acute distress Vital signs  reviewed Eyes, conjunctiva and sclera are normal without pallor or icterus extraocular motions intact and normal there is no lid lag Respiratory, normal effort and excursion no audible wheezing without a stethoscope Cardiovascular, no neck vein distention or peripheral edema Skin, no rash skin lesion or ulceration of the extremities Neurologic, cranial nerves II to XII are grossly intact and the patient moves all 4 extremities Neuro/Psychiatric, judgment and thought processes are intact and coherent, alert and oriented x3, mood and affect appear normal.  ASSESSMENT & PLAN:    1. Heart failure chronic diastolic is improved presently compensated continue his current dose of diuretic especially with taking sodium high supplements Ensure and on gabapentin for pain and in 6 weeks off see physically in the office and we can check labs then including BMP and proBNP or if he sees his PCP first can be done in his office.  I encouraged him to continue to weigh daily 2. Stable doing well with his radiation therapy I encouraged his good attitude and I am pleased to see him in a higher level of health and optimistic of his future  COVID-19 Education: The signs and symptoms of COVID-19 were discussed with the patient and how to seek care for testing (follow up with PCP or arrange E-visit).  The importance of social distancing was discussed today.  Time:   Today, I have spent 25 minutes with the patient with telehealth technology discussing the above problems.     Medication Adjustments/Labs and Tests Ordered: Current medicines are reviewed at length with the patient today.  Concerns regarding medicines are outlined above.   Tests Ordered: No orders of the defined types were placed in this encounter.   Medication Changes: No orders of the defined types were placed in this encounter.   Disposition:  Follow up in 6 week(s)  Signed, Norman Herrlich, MD  10/17/2018 11:08 AM    [facility]

## 2018-10-17 NOTE — Patient Instructions (Signed)
Medication Instructions:  Your physician recommends that you continue on your current medications as directed. Please refer to the Current Medication list given to you today.  If you need a refill on your cardiac medications before your next appointment, please call your pharmacy.   Lab work: None  If you have labs (blood work) drawn today and your tests are completely normal, you will receive your results only by: [name] MyChart Message (if you have MyChart) OR . A paper copy in the mail If you have any lab test that is abnormal or we need to change your treatment, we will call you to review the results.  Testing/Procedures: None  Follow-Up: At [facility], you and your health needs are our priority.  As part of our continuing mission to provide you with exceptional heart care, we have created designated Provider Care Teams.  These Care Teams include your primary Cardiologist (physician) and Advanced Practice Providers (APPs -  Physician Assistants and Nurse Practitioners) who all work together to provide you with the care you need, when you need it. You will need a follow up appointment in 6 weeks.

## 2018-11-07 ENCOUNTER — Telehealth: Payer: Self-pay | Admitting: *Deleted

## 2018-11-07 NOTE — Telephone Encounter (Signed)
-----   Message from Britt Bottom, MD sent at 11/01/2018  5:43 PM EDT ----- Regarding: RE: Repeat CBC Don Hamilton, I would like him to get a CBC with differential at some point over the next month.  He likely will be getting blood work with his oncologist.  If he does not get any in the next few weeks we can place an order.  [place] ----- Message ----- From: Hope Pigeon, RN Sent: 11/01/2018   5:18 PM EDT To: Britt Bottom, MD Subject: FW: Repeat CBC                                 Dr. Felecia Shelling- do you still want him to get labs? He is being treated for cancer right now ----- Message ----- From: Hope Pigeon, RN Sent: 08/02/2018   4:27 PM EDT To: Hope Pigeon, RN Subject: Repeat CBC                                     Needs repeat CBC d/t low lymphocyte count from [facility]

## 2018-11-07 NOTE — Telephone Encounter (Signed)
Called, LVM relaying Dr. Garth Bigness message. Asked him to call back to discuss.

## 2018-11-09 ENCOUNTER — Other Ambulatory Visit: Payer: Self-pay | Admitting: *Deleted

## 2018-11-09 DIAGNOSIS — Z79899 Other long term (current) drug therapy: Secondary | ICD-10-CM

## 2018-11-09 DIAGNOSIS — G35 Multiple sclerosis: Secondary | ICD-10-CM

## 2018-11-21 NOTE — Progress Notes (Signed)
Virtual Visit via Telephone Note   This visit type was conducted due to national recommendations for restrictions regarding the COVID-19 Pandemic (e.g. social distancing) in an effort to limit this patient's exposure and mitigate transmission in our community.  Due to his co-morbid illnesses, this patient is at least at moderate risk for complications without adequate follow up.  This format is felt to be most appropriate for this patient at this time.  The patient did not have access to video technology/had technical difficulties with video requiring transitioning to audio format only (telephone).  All issues noted in this document were discussed and addressed.  No physical exam could be performed with this format.  Please refer to the patient's chart for his  consent to telehealth for [facility].   Date:  11/22/2018   ID:  Don Hamilton, DOB 14-Jun-1961, MRN [id number]  Patient Location: Home Provider Location: Office  PCP:  Sharlene Dory, DO  Cardiologist:  Norman Herrlich, MD  Electrophysiologist:  None   Evaluation Performed:  New Patient Evaluation  Chief Complaint:  Heart failure  History of Present Illness:    Don Hamilton is a 58 y.o. male with a hx of laryngeal cancer with XRT and an abnormal echo and cardiomyopathy  with mildly reduced EF of 45-50% and heart failure.  Last seen 10/17/18.  He is struggling had severe airway compromise required high-dose steroids and his weight went up 6 pounds and he is increasingly edematous.  He will double his diuretic for the next 2 days will take advantage of a novel home health program with a heart failure vest to go out and assess him and decide if we need to continue a higher dose of diuretic looking at his lung water.  He has edema no chest pain palpitation or syncope he weighs daily he cannot restrict sodium because of his limited diet  The patient does not have symptoms concerning for COVID-19 infection (fever, chills,  cough, or new shortness of breath).    Past Medical History:  Diagnosis Date  . Atrial tachycardia (HCC)   . Basal cell carcinoma of face   . Cancer (HCC)   . Headache   . Multiple sclerosis (HCC)   . Vision abnormalities    Past Surgical History:  Procedure Laterality Date  . CARDIAC ELECTROPHYSIOLOGY MAPPING AND ABLATION    . SKIN CANCER EXCISION  2016   nose  . TONSILLECTOMY    . 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 amphetamine-dextroamphetamine (ADDERALL) 10 MG tablet Take 1 tablet (10 mg total) by mouth 2 (two) times daily with a meal. (Patient taking differently: Take 10 mg by mouth as needed. )  . baclofen (LIORESAL) 10 MG tablet Take 1 tablet (10 mg total) by mouth 4 (four) times daily.  . cyclobenzaprine (FLEXERIL) 5 MG tablet Take 5 mg by mouth 3 (three) times daily as needed for muscle spasms.  [name] doxepin (SINEQUAN) 10 MG capsule Take 1 capsule (10 mg total) by mouth at bedtime.  . furosemide (LASIX) 40 MG tablet Take 1 tablet (40 mg total) by mouth daily.  [name] gabapentin (NEURONTIN) 300 MG capsule TAKE 1 CAPSULE BY MOUTH THREE TIMES A DAY  . hydrOXYzine (ATARAX/VISTARIL) 10 MG tablet Take 1 tablet (10 mg total) by mouth 3 (three) times daily as needed.  . mometasone (ELOCON) 0.1 % cream Apply 1 application topically daily. As needed  for  Radiation rash  . oxybutynin (DITROPAN) 5 MG tablet Take 1 tablet (5 mg total) by mouth 2 (two) times daily.  [name] oxyCODONE-acetaminophen (PERCOCET) 10-325 MG tablet Take 1 tablet by mouth every 8 (eight) hours as needed for pain.  . potassium chloride (KLOR-CON 10) 10 MEQ tablet Take 1 tablet (10 mEq total) by mouth daily.  [name] triamcinolone cream (KENALOG) 0.5 % Apply 1 application topically 3 (three) times daily.     Allergies:   Campath [alemtuzumab]; Mavenclad [cladribine]; Avonex [interferon beta-1a]; Betaseron [interferon beta-1b]; and Tysabri [natalizumab]   Social  History   Tobacco Use  . Smoking status: Former Smoker    Packs/day: 0.50    Types: Cigarettes    Start date: 1980    Last attempt to quit: 2020    Years since quitting: 0.3  . Smokeless tobacco: Never Used  Substance Use Topics  . Alcohol use: Yes    Alcohol/week: 0.0 standard drinks    Comment: occasional beer  . Drug use: No     Family Hx: The patient's family history includes Healthy in his mother; Heart attack in his father; Leukemia in his father; Stroke in his father.  ROS:   Please see the history of present illness.     All other systems reviewed and are negative.   Prior CV studies:   The following studies were reviewed today:    Labs/Other Tests and Data Reviewed:    EKG:  No ECG reviewed.  Recent Labs: 10/25/18 BMP is normal, Cr 0.85 K 4.1 08/01/2018: ALT 20; Hemoglobin 13.2; Platelets 159 10/04/2018: BUN 12; Creatinine, Ser 0.93; NT-Pro BNP 63; Potassium 3.9; Sodium 137   Recent Lipid Panel No results found for: CHOL, TRIG, HDL, CHOLHDL, LDLCALC, LDLDIRECT  Wt Readings from Last 3 Encounters:  11/22/18 197 lb (89.4 kg)  10/17/18 198 lb (89.8 kg)  10/03/18 198 lb (89.8 kg)     Objective:    Vital Signs:  BP 140/84   Pulse 72   Ht 6\' 1"  (1.854 m)   Wt 197 lb (89.4 kg)   BMI 25.99 kg/m    VITAL SIGNS:  reviewed hoarse sounds weak and debilitated  ASSESSMENT & PLAN:    1. Heart failure chronic diastolic seems to be mildly decompensated double diuretic for 2 days take advantage of the novel heart failure program and make a decision.  I will bring him back to the office in 2 weeks to optimize treatment and probably require repeat echocardiogram and a decision about vasodilators or Entresto. 2. Cardiomyopathy see above he will require repeat echocardiogram and optimization of treatment  COVID-19 Education: The signs and symptoms of COVID-19 were discussed with the patient and how to seek care for testing (follow up with PCP or arrange E-visit).   The importance of social distancing was discussed today.  Time:   Today, I have spent 20 minutes with the patient with telehealth technology discussing the above problems.     Medication Adjustments/Labs and Tests Ordered: Current medicines are reviewed at length with the patient today.  Concerns regarding medicines are outlined above.   Tests Ordered: No orders of the defined types were placed in this encounter.   Medication Changes: No orders of the defined types were placed in this encounter.   Disposition:  Follow up in 2 week(s)  Signed, Norman Herrlich, MD  11/22/2018 11:48 AM    [facility]

## 2018-11-22 ENCOUNTER — Telehealth: Payer: Self-pay | Admitting: Family

## 2018-11-22 ENCOUNTER — Encounter: Payer: Self-pay | Admitting: Cardiology

## 2018-11-22 ENCOUNTER — Other Ambulatory Visit: Payer: Self-pay

## 2018-11-22 ENCOUNTER — Telehealth (INDEPENDENT_AMBULATORY_CARE_PROVIDER_SITE_OTHER): Payer: Medicare Other | Admitting: Cardiology

## 2018-11-22 VITALS — BP 140/84 | HR 72 | Ht 73.0 in | Wt 197.0 lb

## 2018-11-22 DIAGNOSIS — I5032 Chronic diastolic (congestive) heart failure: Secondary | ICD-10-CM | POA: Diagnosis not present

## 2018-11-22 DIAGNOSIS — I427 Cardiomyopathy due to drug and external agent: Secondary | ICD-10-CM

## 2018-11-22 NOTE — Patient Instructions (Addendum)
Medication Instructions:  Your physician has recommended you make the following change in your medication:   INCREASE furosemide (lasix) 40 mg: Take 1 tablet twice daily for the next two day then DECREASE to original dose of 1 tablet (40 mg) daily  If you need a refill on your cardiac medications before your next appointment, please call your pharmacy.   Lab work: None  If you have labs (blood work) drawn today and your tests are completely normal, you will receive your results only by: [name] MyChart Message (if you have MyChart) OR . A paper copy in the mail If you have any lab test that is abnormal or we need to change your treatment, we will call you to review the results.  Testing/Procedures: You have been referred for the [facility] with the [place] [facility] will be contacted to schedule this appointment.   Follow-Up: At [facility], you and your health needs are our priority.  As part of our continuing mission to provide you with exceptional heart care, we have created designated Provider Care Teams.  These Care Teams include your primary Cardiologist (physician) and Advanced Practice Providers (APPs -  Physician Assistants and Nurse Practitioners) who all work together to provide you with the care you need, when you need it. You will need a follow up appointment in 2 weeks: Wednesday, 12/06/2018, at 11:15 am in the [facility] office. The address is [address] [place], [place] [address].

## 2018-11-22 NOTE — Telephone Encounter (Signed)
[facility] Home Visit Initial Request  Agency Requested:    [facility] Contact:  Dara Hoyer, RN, CCM [address], [address] [place], [place]  [address] [email or web address]  Phone #: [phone] Fax #: [phone]  Patient Demographic Information: Name:  Don Hamilton Age:  58 y.o.   DOB:  Jun 18, 1961  MRN:  [id number]   Address:   [address] [place] [address]   Phone Numbers:   Home Phone [phone]  Mobile [phone]     Emergency Contact Information on File:   Contact Information    Name Relation Home Work Mobile   Kearn,Irene Spouse [phone]        The above family members may be contacted for information on this patient (review DPR on file):  Yes    Patient Clinical Information:  Primary Care Provider:  Sharlene Dory, DO  Primary Cardiologist:  Norman Herrlich, MD  Primary Electrophysiologist:  None   Requesting Provider:  Dr. Norman Herrlich    Past Medical Hx: [name]  has a past medical history of Atrial tachycardia (HCC), Basal cell carcinoma of face, Cancer [facility]), Headache, Multiple sclerosis (HCC), and Vision abnormalities.   Allergies: He is allergic to campath [alemtuzumab]; mavenclad [cladribine]; avonex [interferon beta-1a]; betaseron [interferon beta-1b]; and tysabri [natalizumab].   Medications: Current Outpatient Medications on File Prior to Visit  Medication Sig  . amphetamine-dextroamphetamine (ADDERALL) 10 MG tablet Take 1 tablet (10 mg total) by mouth 2 (two) times daily with a meal. (Patient taking differently: Take 10 mg by mouth as needed. )  . baclofen (LIORESAL) 10 MG tablet Take 1 tablet (10 mg total) by mouth 4 (four) times daily.  . cyclobenzaprine (FLEXERIL) 5 MG tablet Take 5 mg by mouth 3 (three) times daily as needed for muscle spasms.  [name] doxepin (SINEQUAN) 10 MG capsule Take 1 capsule (10 mg total) by mouth at bedtime.  . furosemide (LASIX) 40 MG tablet  Take 1 tablet (40 mg total) by mouth daily.  [name] gabapentin (NEURONTIN) 300 MG capsule TAKE 1 CAPSULE BY MOUTH THREE TIMES A DAY  . hydrOXYzine (ATARAX/VISTARIL) 10 MG tablet Take 1 tablet (10 mg total) by mouth 3 (three) times daily as needed.  . mometasone (ELOCON) 0.1 % cream Apply 1 application topically daily. As needed for  Radiation rash  . oxybutynin (DITROPAN) 5 MG tablet Take 1 tablet (5 mg total) by mouth 2 (two) times daily.  [name] oxyCODONE-acetaminophen (PERCOCET) 10-325 MG tablet Take 1 tablet by mouth every 8 (eight) hours as needed for pain.  . potassium chloride (KLOR-CON 10) 10 MEQ tablet Take 1 tablet (10 mEq total) by mouth daily.  [name] triamcinolone cream (KENALOG) 0.5 % Apply 1 application topically 3 (three) times daily.   No current facility-administered medications on file prior to visit.      Social Hx: He  reports that he quit smoking about 4 months ago. His smoking use included cigarettes. He started smoking about 40 years ago. He smoked 0.50 packs per day. He has never used smokeless tobacco. He reports current alcohol use. He reports that he does not use drugs.    Diagnosis/Reason for Visit:   SOB. Abnormal echocardiogram. Cardiomyopathy.  Services Requested:  Vital Signs (BP, Pulse, O2, Weight)  ReDS Heart Failure Measurement  # of Visits Needed/Frequency per Week: 1 visit per week. Will re-address # of visits needed based on initial ReDS reading.   Yes: A copy of the office note  will be faxed with this form. N/A: All labs ordered for this home visit have been released.

## 2018-11-28 ENCOUNTER — Telehealth: Payer: Self-pay | Admitting: *Deleted

## 2018-11-28 NOTE — Telephone Encounter (Signed)
[facility] from [facility] wants Dr. Bettina Gavia to know that pt has cancelled [facility] reading due to being wiped out from cancer tx. Pt will reschedule for next week if possible.

## 2018-11-29 ENCOUNTER — Other Ambulatory Visit: Payer: Self-pay | Admitting: Neurology

## 2018-12-05 NOTE — Progress Notes (Signed)
Office visit  Date:  12/06/2018   ID:  Don Hamilton, DOB 04/05/1961, MRN [id number]  PCP:  Sharlene Dory, DO  Cardiologist:  Norman Herrlich, MD  Electrophysiologist:  None   Evaluation Performed:  Follow-Up Visit  Chief Complaint:  Heart failure  History of Present Illness:    Don Hamilton is a 58 y.o. male with a hx of an abnormal echo and cardiomyopathy  with mildly reduced EF of 45-50% and heart failure last seen 11/22/18. He has completed radiation therapy about 2 weeks ago and is starting to slowly improve but still is hoarse and has trouble swallowing and is on liquid diet.  He is not short of breath has no edema orthopnea and tolerates his diuretic.  He tells me that he is at risk for carotid stenosis from radiation and that the neurologist or megaly make arrangements to do follow-up cerebral vascular duplexes.  Because of his diuretic therapy will check renal function proBNP level today continue his current diuretic see in the office in 3 months and consider repeat echocardiogram around that time  The patient does not have symptoms concerning for COVID-19 infection (fever, chills, cough, or new shortness of breath).    Past Medical History:  Diagnosis Date  . Atrial tachycardia (HCC)   . Basal cell carcinoma of face   . Cancer (HCC)   . Headache   . Multiple sclerosis (HCC)   . Vision abnormalities    Past Surgical History:  Procedure Laterality Date  . CARDIAC ELECTROPHYSIOLOGY MAPPING AND ABLATION    . SKIN CANCER EXCISION  2016   nose  . TONSILLECTOMY    . 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 amphetamine-dextroamphetamine (ADDERALL) 10 MG tablet Take 10 mg by mouth daily as needed.  . baclofen (LIORESAL) 10 MG tablet TAKE 1 TABLET (10 MG TOTAL) BY MOUTH 4 (FOUR) TIMES DAILY.  . cyclobenzaprine (FLEXERIL) 5 MG tablet Take 5 mg by mouth 3 (three) times daily as needed for muscle spasms.  [name]  doxepin (SINEQUAN) 10 MG capsule Take 1 capsule (10 mg total) by mouth at bedtime.  . furosemide (LASIX) 40 MG tablet Take 1 tablet (40 mg total) by mouth daily.  [name] gabapentin (NEURONTIN) 300 MG capsule TAKE 1 CAPSULE BY MOUTH THREE TIMES A DAY  . hydrOXYzine (ATARAX/VISTARIL) 10 MG tablet Take 1 tablet (10 mg total) by mouth 3 (three) times daily as needed.  . mometasone (ELOCON) 0.1 % cream Apply 1 application topically daily. As needed for  Radiation rash  . oxybutynin (DITROPAN) 5 MG tablet Take 5 mg by mouth 2 (two) times daily as needed for bladder spasms.  . Oxycodone HCl 20 MG TABS Take 1 tablet by mouth every 4 (four) hours as needed.  . potassium chloride (KLOR-CON 10) 10 MEQ tablet Take 1 tablet (10 mEq total) by mouth daily.  [name] triamcinolone cream (KENALOG) 0.5 % Apply 1 application topically 3 (three) times daily as needed.  . [DISCONTINUED] oxybutynin (DITROPAN) 5 MG tablet Take 1 tablet (5 mg total) by mouth 2 (two) times daily. (Patient taking differently: Take 5 mg by mouth 2 (two) times daily as needed. )  . [DISCONTINUED] triamcinolone cream (KENALOG) 0.5 % Apply 1 application topically 3 (three) times daily. (Patient taking differently: Apply 1 application topically 3 (three) times daily as needed. )     Allergies:   Campath [alemtuzumab]; Mavenclad [cladribine]; Avonex [  interferon beta-1a]; Betaseron [interferon beta-1b]; and Tysabri [natalizumab]   Social History   Tobacco Use  . Smoking status: Current Every Day Smoker    Types: Cigarettes    Start date: 66  . Smokeless tobacco: Never Used  . Tobacco comment: 4 cigarettes/day  Substance Use Topics  . Alcohol use: Yes    Alcohol/week: 0.0 standard drinks    Comment: occasional beer  . Drug use: No     Family Hx: The patient's family history includes Healthy in his mother; Heart attack in his father; Leukemia in his father; Stroke in his father.  ROS:   Please see the history of present illness.     All  other systems reviewed and are negative.   Prior CV studies:   The following studies were reviewed today:  Labs/Other Tests and Data Reviewed:    EKG:  No ECG reviewed.  Recent Labs: 08/01/2018: ALT 20; Hemoglobin 13.2; Platelets 159 10/04/2018: BUN 12; Creatinine, Ser 0.93; NT-Pro BNP 63; Potassium 3.9; Sodium 137   Recent Lipid Panel No results found for: CHOL, TRIG, HDL, CHOLHDL, LDLCALC, LDLDIRECT  Wt Readings from Last 3 Encounters:  12/06/18 195 lb 0.6 oz (88.5 kg)  11/22/18 197 lb (89.4 kg)  10/17/18 198 lb (89.8 kg)     Objective:    Vital Signs:  BP 126/82 (BP Location: Right Arm, Patient Position: Sitting, Cuff Size: Normal)   Pulse 81   Temp (!) 95.4 F (35.2 C)   Ht 6\' 1"  (1.854 m)   Wt 195 lb 0.6 oz (88.5 kg)   SpO2 96%   BMI 25.73 kg/m    VITAL SIGNS:  reviewed GEN:  no acute distress EYES:  sclerae anicteric, EOMI - Extraocular Movements Intact RESPIRATORY:  normal respiratory effort, symmetric expansion CARDIOVASCULAR:  no peripheral edema SKIN:  no rash, lesions or ulcers. MUSCULOSKELETAL:  no obvious deformities. NEURO:  alert and oriented x 3, no obvious focal deficit PSYCH:  normal affect He has no neck vein distention or edema no rales on examination regular rhythm no gallop or murmur ASSESSMENT & PLAN:    1. Heart failure chronic diastolic compensated New York Heart Association class I continue his current diuretic check renal function potassium and proBNP level today 2. Cardiomyopathy recheck echocardiogram after next visit 3. Lower extremity edema resolved  COVID-19 Education: The signs and symptoms of COVID-19 were discussed with the patient and how to seek care for testing (follow up with PCP or arrange E-visit).  The importance of social distancing was discussed today.  Time:   Today, I have spent 20 minutes with the patient with telehealth technology discussing the above problems.     Medication Adjustments/Labs and Tests  Ordered: Current medicines are reviewed at length with the patient today.  Concerns regarding medicines are outlined above.   Tests Ordered: Orders Placed This Encounter  Procedures  . Basic Metabolic Panel (BMET)  . Pro b natriuretic peptide (BNP)    Medication Changes: No orders of the defined types were placed in this encounter.   Disposition:  Follow up in 3 month(s)  Signed, Norman Herrlich, MD  12/06/2018 12:09 PM    [facility]

## 2018-12-06 ENCOUNTER — Encounter: Payer: Self-pay | Admitting: Cardiology

## 2018-12-06 ENCOUNTER — Other Ambulatory Visit: Payer: Self-pay

## 2018-12-06 ENCOUNTER — Other Ambulatory Visit: Payer: Self-pay | Admitting: *Deleted

## 2018-12-06 ENCOUNTER — Ambulatory Visit (INDEPENDENT_AMBULATORY_CARE_PROVIDER_SITE_OTHER): Payer: Medicare Other | Admitting: Cardiology

## 2018-12-06 VITALS — BP 126/82 | HR 81 | Temp 95.4°F | Ht 73.0 in | Wt 195.0 lb

## 2018-12-06 DIAGNOSIS — R600 Localized edema: Secondary | ICD-10-CM

## 2018-12-06 DIAGNOSIS — I5032 Chronic diastolic (congestive) heart failure: Secondary | ICD-10-CM

## 2018-12-06 DIAGNOSIS — Z79899 Other long term (current) drug therapy: Secondary | ICD-10-CM

## 2018-12-06 DIAGNOSIS — I427 Cardiomyopathy due to drug and external agent: Secondary | ICD-10-CM

## 2018-12-06 DIAGNOSIS — G35 Multiple sclerosis: Secondary | ICD-10-CM

## 2018-12-06 NOTE — Patient Instructions (Signed)
Medication Instructions:  Your physician recommends that you continue on your current medications as directed. Please refer to the Current Medication list given to you today.  If you need a refill on your cardiac medications before your next appointment, please call your pharmacy.   Lab work: Your physician recommends that you return for lab work today: CBC, BMP, ProBNP.   If you have labs (blood work) drawn today and your tests are completely normal, you will receive your results only by: [name] MyChart Message (if you have MyChart) OR . A paper copy in the mail If you have any lab test that is abnormal or we need to change your treatment, we will call you to review the results.  Testing/Procedures: None  Follow-Up: At [facility], you and your health needs are our priority.  As part of our continuing mission to provide you with exceptional heart care, we have created designated Provider Care Teams.  These Care Teams include your primary Cardiologist (physician) and Advanced Practice Providers (APPs -  Physician Assistants and Nurse Practitioners) who all work together to provide you with the care you need, when you need it. You will need a follow up appointment in 6 months.

## 2018-12-07 LAB — CBC WITH DIFFERENTIAL/PLATELET
Basophils Absolute: 0 10*3/uL (ref 0.0–0.2)
Basos: 0 %
EOS (ABSOLUTE): 0 10*3/uL (ref 0.0–0.4)
Eos: 1 %
Hematocrit: 33.5 % — ABNORMAL LOW (ref 37.5–51.0)
Hemoglobin: 12.5 g/dL — ABNORMAL LOW (ref 13.0–17.7)
Immature Grans (Abs): 0 10*3/uL (ref 0.0–0.1)
Immature Granulocytes: 1 %
Lymphocytes Absolute: 0.3 10*3/uL — ABNORMAL LOW (ref 0.7–3.1)
Lymphs: 6 %
MCH: 36.3 pg — ABNORMAL HIGH (ref 26.6–33.0)
MCHC: 37.3 g/dL — ABNORMAL HIGH (ref 31.5–35.7)
MCV: 97 fL (ref 79–97)
Monocytes Absolute: 0.5 10*3/uL (ref 0.1–0.9)
Monocytes: 9 %
Neutrophils Absolute: 4.2 10*3/uL (ref 1.4–7.0)
Neutrophils: 83 %
Platelets: 195 10*3/uL (ref 150–450)
RBC: 3.44 x10E6/uL — ABNORMAL LOW (ref 4.14–5.80)
RDW: 12.7 % (ref 11.6–15.4)
WBC: 5.1 10*3/uL (ref 3.4–10.8)

## 2018-12-07 LAB — BASIC METABOLIC PANEL
BUN/Creatinine Ratio: 8 — ABNORMAL LOW (ref 9–20)
BUN: 6 mg/dL (ref 6–24)
CO2: 26 mmol/L (ref 20–29)
Calcium: 9.1 mg/dL (ref 8.7–10.2)
Chloride: 92 mmol/L — ABNORMAL LOW (ref 96–106)
Creatinine, Ser: 0.73 mg/dL — ABNORMAL LOW (ref 0.76–1.27)
GFR calc Af Amer: 118 mL/min/{1.73_m2} (ref >59)
GFR calc non Af Amer: 102 mL/min/{1.73_m2} (ref >59)
Glucose: 92 mg/dL (ref 65–99)
Potassium: 3.7 mmol/L (ref 3.5–5.2)
Sodium: 134 mmol/L (ref 134–144)

## 2018-12-07 LAB — PRO B NATRIURETIC PEPTIDE: NT-Pro BNP: 76 pg/mL (ref 0–210)

## 2018-12-11 ENCOUNTER — Telehealth: Payer: Self-pay | Admitting: Neurology

## 2018-12-11 NOTE — Telephone Encounter (Signed)
I called and left a message.  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  It was 0.4 four months ago.

## 2018-12-12 ENCOUNTER — Other Ambulatory Visit: Payer: Self-pay | Admitting: Neurology

## 2018-12-12 MED ORDER — TRIAMCINOLONE ACETONIDE 0.5 % EX CREA: 1.0000 | TOPICAL_CREAM | Freq: Three times a day (TID) | CUTANEOUS | 0 refills | Status: DC | PRN

## 2018-12-12 MED ORDER — HYDROXYZINE HCL 10 MG PO TABS: 10.0000 mg | ORAL_TABLET | Freq: Three times a day (TID) | ORAL | 5 refills | Status: DC | PRN

## 2018-12-12 MED ORDER — CYCLOBENZAPRINE HCL 5 MG PO TABS: 5.0000 mg | ORAL_TABLET | Freq: Two times a day (BID) | ORAL | 5 refills | Status: DC | PRN

## 2018-12-12 MED ORDER — AMPHETAMINE-DEXTROAMPHETAMINE 10 MG PO TABS: ORAL_TABLET | ORAL | 0 refills | Status: DC

## 2018-12-12 MED ORDER — DOXEPIN HCL 10 MG PO CAPS: 10.0000 mg | ORAL_CAPSULE | Freq: Every day | ORAL | 11 refills | Status: DC

## 2018-12-12 NOTE — Telephone Encounter (Signed)
I spoke to Mr. Guevarra.  He has completed radiation treatment and comes back to see ENT for an endoscopic study in 2 to 3 weeks.  Refills were sent in.

## 2019-01-18 DIAGNOSIS — J3801 Paralysis of vocal cords and larynx, unilateral: Secondary | ICD-10-CM | POA: Diagnosis not present

## 2019-01-18 DIAGNOSIS — Z8521 Personal history of malignant neoplasm of larynx: Secondary | ICD-10-CM | POA: Diagnosis not present

## 2019-01-18 DIAGNOSIS — J383 Other diseases of vocal cords: Secondary | ICD-10-CM | POA: Diagnosis not present

## 2019-01-18 DIAGNOSIS — Z01812 Encounter for preprocedural laboratory examination: Secondary | ICD-10-CM | POA: Diagnosis not present

## 2019-01-18 DIAGNOSIS — Z79891 Long term (current) use of opiate analgesic: Secondary | ICD-10-CM | POA: Diagnosis not present

## 2019-01-18 DIAGNOSIS — Z1159 Encounter for screening for other viral diseases: Secondary | ICD-10-CM | POA: Diagnosis not present

## 2019-01-18 DIAGNOSIS — C329 Malignant neoplasm of larynx, unspecified: Secondary | ICD-10-CM | POA: Diagnosis not present

## 2019-01-18 DIAGNOSIS — Z923 Personal history of irradiation: Secondary | ICD-10-CM | POA: Diagnosis not present

## 2019-01-24 DIAGNOSIS — C329 Malignant neoplasm of larynx, unspecified: Secondary | ICD-10-CM | POA: Diagnosis not present

## 2019-01-24 DIAGNOSIS — C320 Malignant neoplasm of glottis: Secondary | ICD-10-CM | POA: Diagnosis not present

## 2019-01-24 DIAGNOSIS — Z923 Personal history of irradiation: Secondary | ICD-10-CM | POA: Diagnosis not present

## 2019-01-24 DIAGNOSIS — R490 Dysphonia: Secondary | ICD-10-CM | POA: Diagnosis not present

## 2019-01-30 DIAGNOSIS — Z7289 Other problems related to lifestyle: Secondary | ICD-10-CM | POA: Diagnosis not present

## 2019-01-30 DIAGNOSIS — J3801 Paralysis of vocal cords and larynx, unilateral: Secondary | ICD-10-CM | POA: Diagnosis not present

## 2019-01-30 DIAGNOSIS — Z923 Personal history of irradiation: Secondary | ICD-10-CM | POA: Diagnosis not present

## 2019-01-30 DIAGNOSIS — C329 Malignant neoplasm of larynx, unspecified: Secondary | ICD-10-CM | POA: Diagnosis not present

## 2019-02-08 DIAGNOSIS — J984 Other disorders of lung: Secondary | ICD-10-CM | POA: Diagnosis not present

## 2019-02-08 DIAGNOSIS — C329 Malignant neoplasm of larynx, unspecified: Secondary | ICD-10-CM | POA: Diagnosis not present

## 2019-02-13 DIAGNOSIS — C329 Malignant neoplasm of larynx, unspecified: Secondary | ICD-10-CM | POA: Diagnosis not present

## 2019-02-13 DIAGNOSIS — C320 Malignant neoplasm of glottis: Secondary | ICD-10-CM | POA: Diagnosis not present

## 2019-02-20 DIAGNOSIS — Z01812 Encounter for preprocedural laboratory examination: Secondary | ICD-10-CM | POA: Diagnosis not present

## 2019-02-20 DIAGNOSIS — Z79899 Other long term (current) drug therapy: Secondary | ICD-10-CM | POA: Diagnosis not present

## 2019-02-20 DIAGNOSIS — I427 Cardiomyopathy due to drug and external agent: Secondary | ICD-10-CM | POA: Diagnosis not present

## 2019-02-20 DIAGNOSIS — Z888 Allergy status to other drugs, medicaments and biological substances status: Secondary | ICD-10-CM | POA: Diagnosis not present

## 2019-02-20 DIAGNOSIS — T451X5A Adverse effect of antineoplastic and immunosuppressive drugs, initial encounter: Secondary | ICD-10-CM | POA: Diagnosis not present

## 2019-02-20 DIAGNOSIS — B370 Candidal stomatitis: Secondary | ICD-10-CM | POA: Diagnosis not present

## 2019-02-20 DIAGNOSIS — C320 Malignant neoplasm of glottis: Secondary | ICD-10-CM | POA: Diagnosis not present

## 2019-02-20 DIAGNOSIS — D6489 Other specified anemias: Secondary | ICD-10-CM | POA: Diagnosis not present

## 2019-02-20 DIAGNOSIS — Z8249 Family history of ischemic heart disease and other diseases of the circulatory system: Secondary | ICD-10-CM | POA: Diagnosis not present

## 2019-02-20 DIAGNOSIS — Z1159 Encounter for screening for other viral diseases: Secondary | ICD-10-CM | POA: Diagnosis not present

## 2019-02-20 DIAGNOSIS — I472 Ventricular tachycardia: Secondary | ICD-10-CM | POA: Diagnosis not present

## 2019-02-20 DIAGNOSIS — R001 Bradycardia, unspecified: Secondary | ICD-10-CM | POA: Diagnosis not present

## 2019-02-20 DIAGNOSIS — C329 Malignant neoplasm of larynx, unspecified: Secondary | ICD-10-CM | POA: Diagnosis not present

## 2019-02-20 DIAGNOSIS — J449 Chronic obstructive pulmonary disease, unspecified: Secondary | ICD-10-CM | POA: Diagnosis not present

## 2019-02-20 DIAGNOSIS — Z923 Personal history of irradiation: Secondary | ICD-10-CM | POA: Diagnosis not present

## 2019-02-20 DIAGNOSIS — Z79891 Long term (current) use of opiate analgesic: Secondary | ICD-10-CM | POA: Diagnosis not present

## 2019-02-20 DIAGNOSIS — R1319 Other dysphagia: Secondary | ICD-10-CM | POA: Diagnosis not present

## 2019-02-20 DIAGNOSIS — G893 Neoplasm related pain (acute) (chronic): Secondary | ICD-10-CM | POA: Diagnosis not present

## 2019-02-20 DIAGNOSIS — T41295A Adverse effect of other general anesthetics, initial encounter: Secondary | ICD-10-CM | POA: Diagnosis not present

## 2019-02-20 DIAGNOSIS — I509 Heart failure, unspecified: Secondary | ICD-10-CM | POA: Diagnosis not present

## 2019-02-20 DIAGNOSIS — K219 Gastro-esophageal reflux disease without esophagitis: Secondary | ICD-10-CM | POA: Diagnosis not present

## 2019-02-20 DIAGNOSIS — G35 Multiple sclerosis: Secondary | ICD-10-CM | POA: Diagnosis not present

## 2019-02-20 DIAGNOSIS — E46 Unspecified protein-calorie malnutrition: Secondary | ICD-10-CM | POA: Diagnosis not present

## 2019-02-20 DIAGNOSIS — Z823 Family history of stroke: Secondary | ICD-10-CM | POA: Diagnosis not present

## 2019-02-20 DIAGNOSIS — R739 Hyperglycemia, unspecified: Secondary | ICD-10-CM | POA: Diagnosis not present

## 2019-02-26 DIAGNOSIS — Z4659 Encounter for fitting and adjustment of other gastrointestinal appliance and device: Secondary | ICD-10-CM | POA: Diagnosis not present

## 2019-02-26 DIAGNOSIS — T451X5A Adverse effect of antineoplastic and immunosuppressive drugs, initial encounter: Secondary | ICD-10-CM | POA: Diagnosis not present

## 2019-02-26 DIAGNOSIS — G8918 Other acute postprocedural pain: Secondary | ICD-10-CM | POA: Diagnosis not present

## 2019-02-26 DIAGNOSIS — Z888 Allergy status to other drugs, medicaments and biological substances status: Secondary | ICD-10-CM | POA: Diagnosis not present

## 2019-02-26 DIAGNOSIS — C329 Malignant neoplasm of larynx, unspecified: Secondary | ICD-10-CM | POA: Diagnosis not present

## 2019-02-26 DIAGNOSIS — I509 Heart failure, unspecified: Secondary | ICD-10-CM | POA: Diagnosis not present

## 2019-02-26 DIAGNOSIS — G8929 Other chronic pain: Secondary | ICD-10-CM | POA: Diagnosis not present

## 2019-02-26 DIAGNOSIS — T41295A Adverse effect of other general anesthetics, initial encounter: Secondary | ICD-10-CM | POA: Diagnosis not present

## 2019-02-26 DIAGNOSIS — Z79899 Other long term (current) drug therapy: Secondary | ICD-10-CM | POA: Diagnosis not present

## 2019-02-26 DIAGNOSIS — Z923 Personal history of irradiation: Secondary | ICD-10-CM | POA: Diagnosis not present

## 2019-02-26 DIAGNOSIS — G35 Multiple sclerosis: Secondary | ICD-10-CM | POA: Diagnosis not present

## 2019-02-26 DIAGNOSIS — C320 Malignant neoplasm of glottis: Secondary | ICD-10-CM | POA: Diagnosis not present

## 2019-02-26 DIAGNOSIS — Z823 Family history of stroke: Secondary | ICD-10-CM | POA: Diagnosis not present

## 2019-02-26 DIAGNOSIS — E46 Unspecified protein-calorie malnutrition: Secondary | ICD-10-CM | POA: Diagnosis not present

## 2019-02-26 DIAGNOSIS — R1310 Dysphagia, unspecified: Secondary | ICD-10-CM | POA: Diagnosis not present

## 2019-02-26 DIAGNOSIS — G893 Neoplasm related pain (acute) (chronic): Secondary | ICD-10-CM | POA: Diagnosis not present

## 2019-02-26 DIAGNOSIS — K219 Gastro-esophageal reflux disease without esophagitis: Secondary | ICD-10-CM | POA: Diagnosis not present

## 2019-02-26 DIAGNOSIS — R0689 Other abnormalities of breathing: Secondary | ICD-10-CM | POA: Diagnosis not present

## 2019-02-26 DIAGNOSIS — I429 Cardiomyopathy, unspecified: Secondary | ICD-10-CM | POA: Diagnosis not present

## 2019-02-26 DIAGNOSIS — C73 Malignant neoplasm of thyroid gland: Secondary | ICD-10-CM | POA: Diagnosis not present

## 2019-02-26 DIAGNOSIS — R070 Pain in throat: Secondary | ICD-10-CM | POA: Diagnosis not present

## 2019-02-26 DIAGNOSIS — D6489 Other specified anemias: Secondary | ICD-10-CM | POA: Diagnosis not present

## 2019-02-26 DIAGNOSIS — B370 Candidal stomatitis: Secondary | ICD-10-CM | POA: Diagnosis not present

## 2019-02-26 DIAGNOSIS — R1319 Other dysphagia: Secondary | ICD-10-CM | POA: Diagnosis not present

## 2019-02-26 DIAGNOSIS — Z8249 Family history of ischemic heart disease and other diseases of the circulatory system: Secondary | ICD-10-CM | POA: Diagnosis not present

## 2019-02-26 DIAGNOSIS — Z79891 Long term (current) use of opiate analgesic: Secondary | ICD-10-CM | POA: Diagnosis not present

## 2019-02-26 DIAGNOSIS — I427 Cardiomyopathy due to drug and external agent: Secondary | ICD-10-CM | POA: Diagnosis not present

## 2019-02-26 DIAGNOSIS — J449 Chronic obstructive pulmonary disease, unspecified: Secondary | ICD-10-CM | POA: Diagnosis not present

## 2019-02-26 DIAGNOSIS — Z9002 Acquired absence of larynx: Secondary | ICD-10-CM | POA: Diagnosis not present

## 2019-02-26 DIAGNOSIS — Z930 Tracheostomy status: Secondary | ICD-10-CM | POA: Diagnosis not present

## 2019-02-26 DIAGNOSIS — M542 Cervicalgia: Secondary | ICD-10-CM | POA: Diagnosis not present

## 2019-02-26 DIAGNOSIS — R739 Hyperglycemia, unspecified: Secondary | ICD-10-CM | POA: Diagnosis not present

## 2019-02-26 DIAGNOSIS — R001 Bradycardia, unspecified: Secondary | ICD-10-CM | POA: Diagnosis not present

## 2019-02-26 DIAGNOSIS — I472 Ventricular tachycardia: Secondary | ICD-10-CM | POA: Diagnosis not present

## 2019-03-13 DIAGNOSIS — R1313 Dysphagia, pharyngeal phase: Secondary | ICD-10-CM | POA: Diagnosis not present

## 2019-03-13 DIAGNOSIS — Z930 Tracheostomy status: Secondary | ICD-10-CM | POA: Diagnosis not present

## 2019-03-13 DIAGNOSIS — G35 Multiple sclerosis: Secondary | ICD-10-CM | POA: Diagnosis not present

## 2019-03-13 DIAGNOSIS — R633 Feeding difficulties: Secondary | ICD-10-CM | POA: Diagnosis not present

## 2019-03-13 DIAGNOSIS — Z923 Personal history of irradiation: Secondary | ICD-10-CM | POA: Diagnosis not present

## 2019-03-13 DIAGNOSIS — C320 Malignant neoplasm of glottis: Secondary | ICD-10-CM | POA: Diagnosis not present

## 2019-03-13 DIAGNOSIS — R1312 Dysphagia, oropharyngeal phase: Secondary | ICD-10-CM | POA: Diagnosis not present

## 2019-03-13 DIAGNOSIS — Z9002 Acquired absence of larynx: Secondary | ICD-10-CM | POA: Diagnosis not present

## 2019-03-27 DIAGNOSIS — C329 Malignant neoplasm of larynx, unspecified: Secondary | ICD-10-CM | POA: Diagnosis not present

## 2019-03-27 DIAGNOSIS — Z87891 Personal history of nicotine dependence: Secondary | ICD-10-CM | POA: Diagnosis not present

## 2019-03-27 DIAGNOSIS — Z79891 Long term (current) use of opiate analgesic: Secondary | ICD-10-CM | POA: Diagnosis not present

## 2019-03-27 DIAGNOSIS — R633 Feeding difficulties: Secondary | ICD-10-CM | POA: Diagnosis not present

## 2019-03-27 DIAGNOSIS — R1313 Dysphagia, pharyngeal phase: Secondary | ICD-10-CM | POA: Diagnosis not present

## 2019-04-05 DIAGNOSIS — Z430 Encounter for attention to tracheostomy: Secondary | ICD-10-CM | POA: Diagnosis not present

## 2019-04-06 DIAGNOSIS — C329 Malignant neoplasm of larynx, unspecified: Secondary | ICD-10-CM | POA: Diagnosis not present

## 2019-04-06 DIAGNOSIS — Z930 Tracheostomy status: Secondary | ICD-10-CM | POA: Diagnosis not present

## 2019-04-06 DIAGNOSIS — J449 Chronic obstructive pulmonary disease, unspecified: Secondary | ICD-10-CM | POA: Diagnosis not present

## 2019-04-10 DIAGNOSIS — Z87891 Personal history of nicotine dependence: Secondary | ICD-10-CM | POA: Diagnosis not present

## 2019-04-10 DIAGNOSIS — Z9002 Acquired absence of larynx: Secondary | ICD-10-CM | POA: Diagnosis not present

## 2019-04-10 DIAGNOSIS — Z923 Personal history of irradiation: Secondary | ICD-10-CM | POA: Diagnosis not present

## 2019-04-10 DIAGNOSIS — C320 Malignant neoplasm of glottis: Secondary | ICD-10-CM | POA: Diagnosis not present

## 2019-04-10 DIAGNOSIS — R1312 Dysphagia, oropharyngeal phase: Secondary | ICD-10-CM | POA: Diagnosis not present

## 2019-04-17 DIAGNOSIS — Z79891 Long term (current) use of opiate analgesic: Secondary | ICD-10-CM | POA: Diagnosis not present

## 2019-04-17 DIAGNOSIS — G893 Neoplasm related pain (acute) (chronic): Secondary | ICD-10-CM | POA: Diagnosis not present

## 2019-04-17 DIAGNOSIS — Z5181 Encounter for therapeutic drug level monitoring: Secondary | ICD-10-CM | POA: Diagnosis not present

## 2019-05-01 DIAGNOSIS — Z87891 Personal history of nicotine dependence: Secondary | ICD-10-CM | POA: Diagnosis not present

## 2019-05-01 DIAGNOSIS — C320 Malignant neoplasm of glottis: Secondary | ICD-10-CM | POA: Diagnosis not present

## 2019-05-01 DIAGNOSIS — Z9002 Acquired absence of larynx: Secondary | ICD-10-CM | POA: Diagnosis not present

## 2019-05-01 DIAGNOSIS — Z930 Tracheostomy status: Secondary | ICD-10-CM | POA: Diagnosis not present

## 2019-05-07 DIAGNOSIS — J449 Chronic obstructive pulmonary disease, unspecified: Secondary | ICD-10-CM | POA: Diagnosis not present

## 2019-05-07 DIAGNOSIS — C329 Malignant neoplasm of larynx, unspecified: Secondary | ICD-10-CM | POA: Diagnosis not present

## 2019-05-07 DIAGNOSIS — Z930 Tracheostomy status: Secondary | ICD-10-CM | POA: Diagnosis not present

## 2019-05-15 DIAGNOSIS — G894 Chronic pain syndrome: Secondary | ICD-10-CM | POA: Diagnosis not present

## 2019-05-15 DIAGNOSIS — D649 Anemia, unspecified: Secondary | ICD-10-CM | POA: Diagnosis not present

## 2019-05-15 DIAGNOSIS — C329 Malignant neoplasm of larynx, unspecified: Secondary | ICD-10-CM | POA: Diagnosis not present

## 2019-05-15 DIAGNOSIS — Z720 Tobacco use: Secondary | ICD-10-CM | POA: Diagnosis not present

## 2019-05-16 DIAGNOSIS — Z79891 Long term (current) use of opiate analgesic: Secondary | ICD-10-CM | POA: Diagnosis not present

## 2019-05-16 DIAGNOSIS — Z483 Aftercare following surgery for neoplasm: Secondary | ICD-10-CM | POA: Diagnosis not present

## 2019-05-16 DIAGNOSIS — Z930 Tracheostomy status: Secondary | ICD-10-CM | POA: Diagnosis not present

## 2019-05-16 DIAGNOSIS — C320 Malignant neoplasm of glottis: Secondary | ICD-10-CM | POA: Diagnosis not present

## 2019-05-16 DIAGNOSIS — G8929 Other chronic pain: Secondary | ICD-10-CM | POA: Diagnosis not present

## 2019-05-16 DIAGNOSIS — Z923 Personal history of irradiation: Secondary | ICD-10-CM | POA: Diagnosis not present

## 2019-05-16 DIAGNOSIS — Z87891 Personal history of nicotine dependence: Secondary | ICD-10-CM | POA: Diagnosis not present

## 2019-05-17 DIAGNOSIS — Z79891 Long term (current) use of opiate analgesic: Secondary | ICD-10-CM | POA: Diagnosis not present

## 2019-05-17 DIAGNOSIS — G893 Neoplasm related pain (acute) (chronic): Secondary | ICD-10-CM | POA: Diagnosis not present

## 2019-05-17 DIAGNOSIS — Z5181 Encounter for therapeutic drug level monitoring: Secondary | ICD-10-CM | POA: Diagnosis not present

## 2019-05-21 DIAGNOSIS — K219 Gastro-esophageal reflux disease without esophagitis: Secondary | ICD-10-CM | POA: Diagnosis not present

## 2019-05-21 DIAGNOSIS — I509 Heart failure, unspecified: Secondary | ICD-10-CM | POA: Diagnosis not present

## 2019-05-21 DIAGNOSIS — C329 Malignant neoplasm of larynx, unspecified: Secondary | ICD-10-CM | POA: Diagnosis not present

## 2019-05-24 DIAGNOSIS — Z7951 Long term (current) use of inhaled steroids: Secondary | ICD-10-CM | POA: Diagnosis not present

## 2019-05-24 DIAGNOSIS — J9600 Acute respiratory failure, unspecified whether with hypoxia or hypercapnia: Secondary | ICD-10-CM | POA: Diagnosis not present

## 2019-05-24 DIAGNOSIS — J988 Other specified respiratory disorders: Secondary | ICD-10-CM | POA: Diagnosis not present

## 2019-05-24 DIAGNOSIS — R0603 Acute respiratory distress: Secondary | ICD-10-CM | POA: Diagnosis not present

## 2019-05-24 DIAGNOSIS — G35 Multiple sclerosis: Secondary | ICD-10-CM | POA: Diagnosis not present

## 2019-05-24 DIAGNOSIS — R061 Stridor: Secondary | ICD-10-CM | POA: Diagnosis not present

## 2019-05-24 DIAGNOSIS — K219 Gastro-esophageal reflux disease without esophagitis: Secondary | ICD-10-CM | POA: Diagnosis not present

## 2019-05-24 DIAGNOSIS — R0602 Shortness of breath: Secondary | ICD-10-CM | POA: Diagnosis not present

## 2019-05-24 DIAGNOSIS — C329 Malignant neoplasm of larynx, unspecified: Secondary | ICD-10-CM | POA: Diagnosis not present

## 2019-05-24 DIAGNOSIS — R0600 Dyspnea, unspecified: Secondary | ICD-10-CM | POA: Diagnosis not present

## 2019-05-24 DIAGNOSIS — Z1159 Encounter for screening for other viral diseases: Secondary | ICD-10-CM | POA: Diagnosis not present

## 2019-05-24 DIAGNOSIS — R918 Other nonspecific abnormal finding of lung field: Secondary | ICD-10-CM | POA: Diagnosis not present

## 2019-05-24 DIAGNOSIS — Z79899 Other long term (current) drug therapy: Secondary | ICD-10-CM | POA: Diagnosis not present

## 2019-05-24 DIAGNOSIS — J449 Chronic obstructive pulmonary disease, unspecified: Secondary | ICD-10-CM | POA: Diagnosis not present

## 2019-05-24 DIAGNOSIS — C320 Malignant neoplasm of glottis: Secondary | ICD-10-CM | POA: Diagnosis not present

## 2019-05-24 DIAGNOSIS — Z9002 Acquired absence of larynx: Secondary | ICD-10-CM | POA: Diagnosis not present

## 2019-05-29 DIAGNOSIS — R918 Other nonspecific abnormal finding of lung field: Secondary | ICD-10-CM | POA: Diagnosis not present

## 2019-05-29 DIAGNOSIS — Z79899 Other long term (current) drug therapy: Secondary | ICD-10-CM | POA: Diagnosis not present

## 2019-05-29 DIAGNOSIS — J9500 Unspecified tracheostomy complication: Secondary | ICD-10-CM | POA: Diagnosis not present

## 2019-05-29 DIAGNOSIS — R0602 Shortness of breath: Secondary | ICD-10-CM | POA: Diagnosis not present

## 2019-05-29 DIAGNOSIS — R042 Hemoptysis: Secondary | ICD-10-CM | POA: Diagnosis not present

## 2019-05-29 DIAGNOSIS — C320 Malignant neoplasm of glottis: Secondary | ICD-10-CM | POA: Diagnosis not present

## 2019-05-29 DIAGNOSIS — J479 Bronchiectasis, uncomplicated: Secondary | ICD-10-CM | POA: Diagnosis not present

## 2019-05-29 DIAGNOSIS — Z4682 Encounter for fitting and adjustment of non-vascular catheter: Secondary | ICD-10-CM | POA: Diagnosis not present

## 2019-05-29 DIAGNOSIS — R221 Localized swelling, mass and lump, neck: Secondary | ICD-10-CM | POA: Diagnosis not present

## 2019-05-29 DIAGNOSIS — J9501 Hemorrhage from tracheostomy stoma: Secondary | ICD-10-CM | POA: Diagnosis not present

## 2019-05-29 DIAGNOSIS — J449 Chronic obstructive pulmonary disease, unspecified: Secondary | ICD-10-CM | POA: Diagnosis not present

## 2019-05-29 DIAGNOSIS — Z430 Encounter for attention to tracheostomy: Secondary | ICD-10-CM | POA: Diagnosis not present

## 2019-05-29 DIAGNOSIS — Z9002 Acquired absence of larynx: Secondary | ICD-10-CM | POA: Diagnosis not present

## 2019-05-29 DIAGNOSIS — G35 Multiple sclerosis: Secondary | ICD-10-CM | POA: Diagnosis not present

## 2019-06-06 DIAGNOSIS — C329 Malignant neoplasm of larynx, unspecified: Secondary | ICD-10-CM | POA: Diagnosis not present

## 2019-06-06 DIAGNOSIS — Z930 Tracheostomy status: Secondary | ICD-10-CM | POA: Diagnosis not present

## 2019-06-06 DIAGNOSIS — J449 Chronic obstructive pulmonary disease, unspecified: Secondary | ICD-10-CM | POA: Diagnosis not present

## 2019-06-08 DIAGNOSIS — Z430 Encounter for attention to tracheostomy: Secondary | ICD-10-CM | POA: Diagnosis not present

## 2019-06-11 DIAGNOSIS — J449 Chronic obstructive pulmonary disease, unspecified: Secondary | ICD-10-CM | POA: Diagnosis not present

## 2019-06-11 DIAGNOSIS — K219 Gastro-esophageal reflux disease without esophagitis: Secondary | ICD-10-CM | POA: Diagnosis not present

## 2019-06-11 DIAGNOSIS — G35 Multiple sclerosis: Secondary | ICD-10-CM | POA: Diagnosis not present

## 2019-06-11 DIAGNOSIS — Z930 Tracheostomy status: Secondary | ICD-10-CM | POA: Diagnosis not present

## 2019-06-11 DIAGNOSIS — C329 Malignant neoplasm of larynx, unspecified: Secondary | ICD-10-CM | POA: Diagnosis not present

## 2019-06-11 DIAGNOSIS — I509 Heart failure, unspecified: Secondary | ICD-10-CM | POA: Diagnosis not present

## 2019-06-11 DIAGNOSIS — Z888 Allergy status to other drugs, medicaments and biological substances status: Secondary | ICD-10-CM | POA: Diagnosis not present

## 2019-06-11 DIAGNOSIS — Z79899 Other long term (current) drug therapy: Secondary | ICD-10-CM | POA: Diagnosis not present

## 2019-06-14 DIAGNOSIS — I5030 Unspecified diastolic (congestive) heart failure: Secondary | ICD-10-CM | POA: Diagnosis not present

## 2019-06-19 ENCOUNTER — Ambulatory Visit: Payer: Medicare Other | Admitting: Cardiology

## 2019-06-19 DIAGNOSIS — H40003 Preglaucoma, unspecified, bilateral: Secondary | ICD-10-CM | POA: Diagnosis not present

## 2019-06-21 DIAGNOSIS — Z720 Tobacco use: Secondary | ICD-10-CM | POA: Diagnosis not present

## 2019-06-21 DIAGNOSIS — J439 Emphysema, unspecified: Secondary | ICD-10-CM | POA: Diagnosis not present

## 2019-06-21 DIAGNOSIS — I429 Cardiomyopathy, unspecified: Secondary | ICD-10-CM | POA: Diagnosis not present

## 2019-06-21 DIAGNOSIS — R030 Elevated blood-pressure reading, without diagnosis of hypertension: Secondary | ICD-10-CM | POA: Diagnosis not present

## 2019-06-21 DIAGNOSIS — G35 Multiple sclerosis: Secondary | ICD-10-CM | POA: Diagnosis not present

## 2019-06-26 ENCOUNTER — Ambulatory Visit: Payer: Medicare Other | Admitting: Cardiology

## 2019-06-28 DIAGNOSIS — C329 Malignant neoplasm of larynx, unspecified: Secondary | ICD-10-CM | POA: Diagnosis not present

## 2019-06-28 DIAGNOSIS — J9509 Other tracheostomy complication: Secondary | ICD-10-CM | POA: Diagnosis not present

## 2019-06-28 DIAGNOSIS — R0600 Dyspnea, unspecified: Secondary | ICD-10-CM | POA: Diagnosis not present

## 2019-06-28 DIAGNOSIS — R0602 Shortness of breath: Secondary | ICD-10-CM | POA: Diagnosis not present

## 2019-06-28 DIAGNOSIS — Z930 Tracheostomy status: Secondary | ICD-10-CM | POA: Diagnosis not present

## 2019-07-01 DIAGNOSIS — Z01818 Encounter for other preprocedural examination: Secondary | ICD-10-CM | POA: Diagnosis not present

## 2019-07-02 DIAGNOSIS — C320 Malignant neoplasm of glottis: Secondary | ICD-10-CM | POA: Diagnosis not present

## 2019-07-02 DIAGNOSIS — Z923 Personal history of irradiation: Secondary | ICD-10-CM | POA: Diagnosis not present

## 2019-07-02 DIAGNOSIS — C329 Malignant neoplasm of larynx, unspecified: Secondary | ICD-10-CM | POA: Diagnosis not present

## 2019-07-02 DIAGNOSIS — Z930 Tracheostomy status: Secondary | ICD-10-CM | POA: Diagnosis not present

## 2019-07-02 DIAGNOSIS — G35 Multiple sclerosis: Secondary | ICD-10-CM | POA: Diagnosis not present

## 2019-07-02 DIAGNOSIS — C9590 Leukemia, unspecified not having achieved remission: Secondary | ICD-10-CM | POA: Diagnosis not present

## 2019-07-02 DIAGNOSIS — I509 Heart failure, unspecified: Secondary | ICD-10-CM | POA: Diagnosis not present

## 2019-07-02 DIAGNOSIS — J988 Other specified respiratory disorders: Secondary | ICD-10-CM | POA: Diagnosis not present

## 2019-07-02 DIAGNOSIS — Z79899 Other long term (current) drug therapy: Secondary | ICD-10-CM | POA: Diagnosis not present

## 2019-07-02 DIAGNOSIS — K219 Gastro-esophageal reflux disease without esophagitis: Secondary | ICD-10-CM | POA: Diagnosis not present

## 2019-07-02 DIAGNOSIS — J449 Chronic obstructive pulmonary disease, unspecified: Secondary | ICD-10-CM | POA: Diagnosis not present

## 2019-07-02 DIAGNOSIS — Z8521 Personal history of malignant neoplasm of larynx: Secondary | ICD-10-CM | POA: Diagnosis not present

## 2019-07-02 DIAGNOSIS — J387 Other diseases of larynx: Secondary | ICD-10-CM | POA: Diagnosis not present

## 2019-07-02 DIAGNOSIS — Z9002 Acquired absence of larynx: Secondary | ICD-10-CM | POA: Diagnosis not present

## 2019-07-07 DIAGNOSIS — C329 Malignant neoplasm of larynx, unspecified: Secondary | ICD-10-CM | POA: Diagnosis not present

## 2019-07-07 DIAGNOSIS — Z930 Tracheostomy status: Secondary | ICD-10-CM | POA: Diagnosis not present

## 2019-07-07 DIAGNOSIS — J449 Chronic obstructive pulmonary disease, unspecified: Secondary | ICD-10-CM | POA: Diagnosis not present

## 2019-07-10 DIAGNOSIS — Z430 Encounter for attention to tracheostomy: Secondary | ICD-10-CM | POA: Diagnosis not present

## 2019-07-12 DIAGNOSIS — Z5181 Encounter for therapeutic drug level monitoring: Secondary | ICD-10-CM | POA: Diagnosis not present

## 2019-07-12 DIAGNOSIS — G893 Neoplasm related pain (acute) (chronic): Secondary | ICD-10-CM | POA: Diagnosis not present

## 2019-07-12 DIAGNOSIS — Z79891 Long term (current) use of opiate analgesic: Secondary | ICD-10-CM | POA: Diagnosis not present

## 2019-07-16 DIAGNOSIS — Z8521 Personal history of malignant neoplasm of larynx: Secondary | ICD-10-CM | POA: Diagnosis not present

## 2019-07-16 DIAGNOSIS — Z923 Personal history of irradiation: Secondary | ICD-10-CM | POA: Diagnosis not present

## 2019-07-16 DIAGNOSIS — Z09 Encounter for follow-up examination after completed treatment for conditions other than malignant neoplasm: Secondary | ICD-10-CM | POA: Diagnosis not present

## 2019-07-16 DIAGNOSIS — C329 Malignant neoplasm of larynx, unspecified: Secondary | ICD-10-CM | POA: Diagnosis not present

## 2019-07-16 DIAGNOSIS — Z930 Tracheostomy status: Secondary | ICD-10-CM | POA: Diagnosis not present

## 2019-07-16 DIAGNOSIS — Z448 Encounter for fitting and adjustment of other external prosthetic devices: Secondary | ICD-10-CM | POA: Diagnosis not present

## 2019-07-23 DIAGNOSIS — I5030 Unspecified diastolic (congestive) heart failure: Secondary | ICD-10-CM | POA: Diagnosis not present

## 2019-07-23 DIAGNOSIS — Z930 Tracheostomy status: Secondary | ICD-10-CM | POA: Diagnosis not present

## 2019-07-23 DIAGNOSIS — C329 Malignant neoplasm of larynx, unspecified: Secondary | ICD-10-CM | POA: Diagnosis not present

## 2019-07-23 DIAGNOSIS — I509 Heart failure, unspecified: Secondary | ICD-10-CM | POA: Diagnosis not present

## 2019-08-07 DIAGNOSIS — J449 Chronic obstructive pulmonary disease, unspecified: Secondary | ICD-10-CM | POA: Diagnosis not present

## 2019-08-07 DIAGNOSIS — C329 Malignant neoplasm of larynx, unspecified: Secondary | ICD-10-CM | POA: Diagnosis not present

## 2019-08-07 DIAGNOSIS — Z930 Tracheostomy status: Secondary | ICD-10-CM | POA: Diagnosis not present

## 2019-08-30 DIAGNOSIS — Z79891 Long term (current) use of opiate analgesic: Secondary | ICD-10-CM | POA: Diagnosis not present

## 2019-08-30 DIAGNOSIS — Z5181 Encounter for therapeutic drug level monitoring: Secondary | ICD-10-CM | POA: Diagnosis not present

## 2019-08-30 DIAGNOSIS — G893 Neoplasm related pain (acute) (chronic): Secondary | ICD-10-CM | POA: Diagnosis not present

## 2019-09-04 DIAGNOSIS — J449 Chronic obstructive pulmonary disease, unspecified: Secondary | ICD-10-CM | POA: Diagnosis not present

## 2019-09-04 DIAGNOSIS — Z930 Tracheostomy status: Secondary | ICD-10-CM | POA: Diagnosis not present

## 2019-09-04 DIAGNOSIS — C329 Malignant neoplasm of larynx, unspecified: Secondary | ICD-10-CM | POA: Diagnosis not present

## 2019-10-03 DIAGNOSIS — Z930 Tracheostomy status: Secondary | ICD-10-CM | POA: Diagnosis not present

## 2019-10-03 DIAGNOSIS — M542 Cervicalgia: Secondary | ICD-10-CM | POA: Diagnosis not present

## 2019-10-03 DIAGNOSIS — C329 Malignant neoplasm of larynx, unspecified: Secondary | ICD-10-CM | POA: Diagnosis not present

## 2019-10-03 DIAGNOSIS — R1310 Dysphagia, unspecified: Secondary | ICD-10-CM | POA: Diagnosis not present

## 2019-10-04 ENCOUNTER — Ambulatory Visit: Payer: Medicare Other | Attending: Internal Medicine

## 2019-10-04 DIAGNOSIS — Z23 Encounter for immunization: Secondary | ICD-10-CM

## 2019-10-04 NOTE — Progress Notes (Signed)
Covid-19 Vaccination Clinic  Name:  Don Hamilton    MRN: [id number] DOB: [birth year]  10/04/2019  Mr. [name] was observed post Covid-19 immunization for 30 minutes based on pre-vaccination screening without incident. He was provided with Vaccine Information Sheet and instruction to access the V-Safe system.   Mr. [name] was instructed to call 911 with any severe reactions post vaccine: [name] Difficulty breathing  . Swelling of face and throat  . A fast heartbeat  . A bad rash all over body  . Dizziness and weakness   Immunizations Administered    Name Date Dose VIS Date Route   Pfizer COVID-19 Vaccine 10/04/2019 11:45 AM 0.3 mL 06/15/2019 Intramuscular   Manufacturer: [employer], [employer]   Lot: [id number]   NDC: [address]-4

## 2019-10-05 DIAGNOSIS — J449 Chronic obstructive pulmonary disease, unspecified: Secondary | ICD-10-CM | POA: Diagnosis not present

## 2019-10-05 DIAGNOSIS — C329 Malignant neoplasm of larynx, unspecified: Secondary | ICD-10-CM | POA: Diagnosis not present

## 2019-10-05 DIAGNOSIS — Z930 Tracheostomy status: Secondary | ICD-10-CM | POA: Diagnosis not present

## 2019-10-08 ENCOUNTER — Ambulatory Visit: Payer: Medicare Other | Admitting: Neurology

## 2019-10-08 ENCOUNTER — Encounter: Payer: Self-pay | Admitting: Neurology

## 2019-10-08 ENCOUNTER — Other Ambulatory Visit: Payer: Self-pay

## 2019-10-08 VITALS — BP 132/84 | HR 69 | Temp 98.1°F | Ht 73.0 in | Wt 159.0 lb

## 2019-10-08 DIAGNOSIS — G35 Multiple sclerosis: Secondary | ICD-10-CM

## 2019-10-08 DIAGNOSIS — Z930 Tracheostomy status: Secondary | ICD-10-CM | POA: Diagnosis not present

## 2019-10-08 DIAGNOSIS — Z79899 Other long term (current) drug therapy: Secondary | ICD-10-CM | POA: Diagnosis not present

## 2019-10-08 DIAGNOSIS — R1310 Dysphagia, unspecified: Secondary | ICD-10-CM | POA: Insufficient documentation

## 2019-10-08 DIAGNOSIS — R269 Unspecified abnormalities of gait and mobility: Secondary | ICD-10-CM

## 2019-10-08 DIAGNOSIS — C329 Malignant neoplasm of larynx, unspecified: Secondary | ICD-10-CM

## 2019-10-08 MED ORDER — BACLOFEN 10 MG PO TABS: 10.0000 mg | ORAL_TABLET | Freq: Four times a day (QID) | ORAL | 11 refills | Status: DC

## 2019-10-08 MED ORDER — CYCLOBENZAPRINE HCL 5 MG PO TABS: 5.0000 mg | ORAL_TABLET | Freq: Two times a day (BID) | ORAL | 5 refills | Status: DC | PRN

## 2019-10-08 MED ORDER — GABAPENTIN 300 MG PO CAPS: ORAL_CAPSULE | ORAL | 11 refills | Status: DC

## 2019-10-08 MED ORDER — AMPHETAMINE-DEXTROAMPHETAMINE 10 MG PO TABS: ORAL_TABLET | ORAL | 0 refills | Status: DC

## 2019-10-08 MED ORDER — TRIAMCINOLONE ACETONIDE 0.5 % EX CREA: 1.0000 "application " | TOPICAL_CREAM | Freq: Three times a day (TID) | CUTANEOUS | 0 refills | Status: DC | PRN

## 2019-10-08 MED ORDER — HYDROXYZINE HCL 10 MG PO TABS: 10.0000 mg | ORAL_TABLET | Freq: Three times a day (TID) | ORAL | 5 refills | Status: DC | PRN

## 2019-10-08 MED ORDER — BUSPIRONE HCL 15 MG PO TABS: 15.0000 mg | ORAL_TABLET | Freq: Two times a day (BID) | ORAL | 5 refills | Status: DC

## 2019-10-08 MED ORDER — DOXEPIN HCL 10 MG PO CAPS: 10.0000 mg | ORAL_CAPSULE | Freq: Every day | ORAL | 11 refills | Status: DC

## 2019-10-08 MED ORDER — OXYBUTYNIN CHLORIDE 5 MG PO TABS: 5.0000 mg | ORAL_TABLET | Freq: Two times a day (BID) | ORAL | 11 refills | Status: DC | PRN

## 2019-10-08 NOTE — Progress Notes (Signed)
[facility]  PATIENT: Don Hamilton DOB: July 03, 1961  REFERRING DOCTOR OR PCP:  None SOURCE: patient, records from [facility].  _________________________________   HISTORICAL  CHIEF COMPLAINT:  Chief Complaint  Patient presents with  . Follow-up    RM 12, alone. Last seen 07/24/2018. Needs refills on medication/wanting to discuss throat cancer surgeons.   . Gait Problem    Ambulates with cane    HISTORY Don Hamilton is a 59 y.o. man with multiple sclerosis.  Update 10/08/2019:  Since the last visit, he was diagnosed with throat cancer.  Due to this finding, and his poor tolerability of the medication, a second year of Mavenclad was not initiated.   He had surgery followed by 6 weeks radiation.  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   He ws referred to a doctor at [facility] and had vocal cord removed and trach placed.  He had a reconstruction operation as well.   He was on a feeding tube for several months.   The trach was removed and he needed an emergency trach shortly later.   He had additional tumors and then started to see an ENT surgeon at [facility].   She felt the newer CT changes were scar not tumor.   He is having chronic pain in the right jaw.    For throat pain he is on oxycodone 20 mg po qid.  He sees pain management in [place]  In the past, he had been on Betaseron, Tysabri, Aubagio, Tecfidera and Mavenclad.  He had had difficulty tolerating these medications.    Currently, he is ambulating with a cane due to the gait be unstable.  He notes some weakness in his legs and mild spasticity.  Baclofen has helped.     Oxybutynin has helped his urinary frequency.  He notes a lot of fatigue and insomnia is also doing worse.  He gets some benefit from Adderall for the fatigue and needs a new refill.  He felt he did better with the immediate release Adderall than the time release.    He has insomnia helped by doxepin.  He has anxiety and  depression .   He is on sertraline  He has lost 50 pounds.   He is able to swallow liquids, better than last year.   He has trouble with food.  He also has some CHF and is on lasix.      MS History:   He presented in 1998 with left optic neuritis. An MRI of the brain was consistent with multiple sclerosis. He was started on Betaseron but he stopped due to allergic reactions and injection site reactions. Avonex was tried but since he stopped after a while due to rash. When Tysabri was reintroduced around 2006, he went on that for about a year or so. However, he would get a rash with each infusion. Therefore, it was discontinued and he was placed on off label Campath. He received his first year dose but had severe infusion reactions with rash that lasted over a month. Therefore, sucking year Campath was not performed. In 2015, he was placed on Aubagio. After month it was stopped due to diarrhea he has not been on Tecfidera. Gilenya was considered but he does have a cardiac arrhythmia. Last year, Dr. Alton Revere placed him on Copaxone but he decided not to take it as it is generally weaker than other medications he has been on.  He received 5 days Mavenclad in October/November 2019.  Due to side effects, with fatigue and rash did not receive a second course 1 month later   REVIEW OF SYSTEMS: Constitutional: No fevers, chills, sweats, or change in appetite.   He has fatigue and insomnia Eyes: No visual changes, double vision, eye pain Ear, nose and throat: No hearing loss, ear pain, nasal congestion, sore throat Cardiovascular: No chest pain, palpitations Respiratory: No shortness of breath at rest or with exertion.   No wheezes GastrointestinaI: No nausea, vomiting, diarrhea, abdominal pain, fecal incontinence Genitourinary:He has urinary frequency.. Musculoskeletal: No neck pain.  Notes right hip > back pain Integumentary: No rash, pruritus, skin lesions Neurological: as above Psychiatric:  Currently denies depression or anxiety. Endocrine: No palpitations, diaphoresis, change in appetite, change in weigh or increased thirst Hematologic/Lymphatic: No anemia, purpura, petechiae. Allergic/Immunologic: No itchy/runny eyes, nasal congestion, recent allergic reactions, rashes  ALLERGIES: Allergies  Allergen Reactions  . Campath [Alemtuzumab] Hives  . Mavenclad [Cladribine]     Flu-like sx  . Avonex [Interferon Beta-1a] Rash  . Betaseron [Interferon Beta-1b] Rash  . Tysabri [Natalizumab] Rash    HOME MEDICATIONS:  Current Outpatient Medications:  .  amphetamine-dextroamphetamine (ADDERALL) 10 MG tablet, One po qAm and one po qnoon, Disp: 60 tablet, Rfl: 0 .  baclofen (LIORESAL) 10 MG tablet, Take 1 tablet (10 mg total) by mouth 4 (four) times daily., Disp: 120 tablet, Rfl: 11 .  cyclobenzaprine (FLEXERIL) 5 MG tablet, Take 1 tablet (5 mg total) by mouth 2 (two) times daily as needed for muscle spasms., Disp: 60 tablet, Rfl: 5 .  doxepin (SINEQUAN) 10 MG capsule, Take 1 capsule (10 mg total) by mouth at bedtime., Disp: 30 capsule, Rfl: 11 .  furosemide (LASIX) 40 MG tablet, Take 1 tablet (40 mg total) by mouth daily., Disp: 90 tablet, Rfl: 1 .  gabapentin (NEURONTIN) 300 MG capsule, TAKE 1 CAPSULE BY MOUTH THREE TIMES A DAY, Disp: 90 capsule, Rfl: 11 .  hydrOXYzine (ATARAX/VISTARIL) 10 MG tablet, Take 1 tablet (10 mg total) by mouth 3 (three) times daily as needed., Disp: 90 tablet, Rfl: 5 .  oxybutynin (DITROPAN) 5 MG tablet, Take 1 tablet (5 mg total) by mouth 2 (two) times daily as needed for bladder spasms., Disp: 60 tablet, Rfl: 11 .  Oxycodone HCl 20 MG TABS, Take 1 tablet by mouth every 4 (four) hours as needed., Disp: , Rfl:  .  potassium chloride (KLOR-CON 10) 10 MEQ tablet, Take 1 tablet (10 mEq total) by mouth daily., Disp: 90 tablet, Rfl: 2 .  triamcinolone cream (KENALOG) 0.5 %, Apply 1 application topically 3 (three) times daily as needed., Disp: 30 g, Rfl:  0  PAST MEDICAL HISTORY: Past Medical History:  Diagnosis Date  . Atrial tachycardia (HCC)   . Basal cell carcinoma of face   . Cancer (HCC)   . Headache   . Multiple sclerosis (HCC)   . Vision abnormalities     PAST SURGICAL HISTORY: Past Surgical History:  Procedure Laterality Date  . CARDIAC ELECTROPHYSIOLOGY MAPPING AND ABLATION    . SKIN CANCER EXCISION  2016   nose  . TONSILLECTOMY    . TUMOR REMOVAL Left 09/22/2018   tumor removed from left vocal cord    FAMILY HISTORY: Family History  Problem Relation Age of Onset  . Healthy Mother   . Stroke Father   . Heart attack Father   . Leukemia Father     SOCIAL HISTORY:  Social History   Socioeconomic History  . Marital status: Married  Spouse name: Not on file  . Number of children: Not on file  . Years of education: Not on file  . Highest education level: Not on file  Occupational History  . Not on file  Tobacco Use  . Smoking status: Current Every Day Smoker    Types: Cigarettes    Start date: 35  . Smokeless tobacco: Never Used  . Tobacco comment: 4 cigarettes/day  Substance and Sexual Activity  . Alcohol use: Yes    Alcohol/week: 0.0 standard drinks    Comment: occasional beer  . Drug use: No  . Sexual activity: Not on file  Other Topics Concern  . Not on file  Social History Narrative  . Not on file   Social Determinants of Health   Financial Resource Strain:   . Difficulty of Paying Living Expenses:   Food Insecurity:   . Worried About Programme researcher, broadcasting/film/video in the Last Year:   . Barista in the Last Year:   Transportation Needs:   . Freight forwarder (Medical):   [name] Lack of Transportation (Non-Medical):   Physical Activity:   . Days of Exercise per Week:   . Minutes of Exercise per Session:   Stress:   . Feeling of Stress :   Social Connections:   . Frequency of Communication with Friends and Family:   . Frequency of Social Gatherings with Friends and Family:   .  Attends Religious Services:   . Active Member of Clubs or Organizations:   . Attends Banker Meetings:   [name] Marital Status:   Intimate Partner Violence:   . Fear of Current or Ex-Partner:   . Emotionally Abused:   [name] Physically Abused:   . Sexually Abused:      PHYSICAL EXAM  Vitals:   10/08/19 1428  BP: 132/84  Pulse: 69  Temp: 98.1 F (36.7 C)  Weight: 159 lb (72.1 kg)  Height: 6\' 1"  (1.854 m)    Body mass index is 20.98 kg/m.   General: The patient is well-developed and well-nourished and in no acute distress.   He has mild edema at ankles.  Status post tracheostomy     Neurologic Exam  Mental status: The patient is alert and oriented x 3 at the time of the examination. The patient has apparent normal recent and remote memory, with reuced attention span and concentration ability.   No aphasia..  Cranial nerves: Extraocular movements are full.    Facial strength and sensation is normal. Trapezius strength is strong.. No obvious hearing deficits are noted.  Motor:  Muscle bulk is normal.  Muscle tone is increased in the legs. Strength is 4/5 in the right hip flexors and right toe and ankle extensors..   Sensory: Sensory testing shows reduced sensation in right foot.  Coordination: Finger-nose-finger is performed well. Heel-to-shin is performed poorly on both legs.   Gait and station: Station is normal.  Gait is wide and arthritic.  The tandem gait is wide.  Romberg is negative.       Reflexes: Deep tendon reflexes are symmetric and increased in legs , R>L. He has crossed adductor responses.    No ankle clonus plantar responses are flexor.      ASSESSMENT AND PLAN  Multiple sclerosis (HCC)  Laryngeal cancer (HCC) - Plan: Ambulatory referral to ENT  Tracheostomy dependence (HCC) - Plan: Ambulatory referral to ENT  Gait disturbance  High risk medication use  Dysphagia, unspecified type - Plan: Ambulatory referral  to ENT   1.   Will stay off of  an MS DMT at this time.  He has had trouble tolerating multiple different medications.   He would likely have an extended benefit from the [facility] lasting over a year. 2.   He would like a second opinion regarding the throat cancer.  I will send in a referral to [facility] 3.   Add buspirone,  If anxiety not better, consider changing buspirone to Abilify.   4.   Renew other medi's.  renew Adderall for attention deficit and MS fatigue.    He sees Earley Abide for Pain management in [place] and is on oxycodone.     5.   Return to see me in 6 months or sooner if there are new or worsening neurologic symptoms.   45-minute office visit with the majority of the time spent face-to-face for history and physical, discussion/counseling and decision-making.  Additional time with record review and documentation.   Arrow Tomko A. Epimenio Foot, MD, PhD 10/08/2019, 3:18 PM Certified in Neurology, Clinical Neurophysiology, Sleep Medicine, Pain Medicine and Neuroimaging  [facility] [address] [place], [place] [address] [phone]

## 2019-10-31 ENCOUNTER — Ambulatory Visit: Payer: Medicare Other | Attending: Internal Medicine

## 2019-10-31 DIAGNOSIS — Z23 Encounter for immunization: Secondary | ICD-10-CM

## 2019-10-31 NOTE — Progress Notes (Signed)
Covid-19 Vaccination Clinic  Name:  Don Hamilton    MRN: [id number] DOB: [birth year]  10/31/2019  [name] was observed post Covid-19 immunization for 30 minutes based on pre-vaccination screening  without incident. He was provided with Vaccine Information Sheet and instruction to access the V-Safe system.   [name] was instructed to call 911 with any severe reactions post vaccine: [name] Difficulty breathing  . Swelling of face and throat  . A fast heartbeat  . A bad rash all over body  . Dizziness and weakness   Immunizations Administered    Name Date Dose VIS Date Route   Pfizer COVID-19 Vaccine 10/31/2019 11:47 AM 0.3 mL 08/29/2018 Intramuscular   Manufacturer: [employer], [employer]   Lot: [id number]   NDC: [address]-2

## 2019-11-01 ENCOUNTER — Other Ambulatory Visit: Payer: Self-pay | Admitting: Cardiology

## 2019-11-04 DIAGNOSIS — J449 Chronic obstructive pulmonary disease, unspecified: Secondary | ICD-10-CM | POA: Diagnosis not present

## 2019-11-04 DIAGNOSIS — C329 Malignant neoplasm of larynx, unspecified: Secondary | ICD-10-CM | POA: Diagnosis not present

## 2019-11-04 DIAGNOSIS — Z930 Tracheostomy status: Secondary | ICD-10-CM | POA: Diagnosis not present

## 2019-11-05 DIAGNOSIS — Z5181 Encounter for therapeutic drug level monitoring: Secondary | ICD-10-CM | POA: Diagnosis not present

## 2019-11-05 DIAGNOSIS — Z79891 Long term (current) use of opiate analgesic: Secondary | ICD-10-CM | POA: Diagnosis not present

## 2019-11-05 DIAGNOSIS — G893 Neoplasm related pain (acute) (chronic): Secondary | ICD-10-CM | POA: Diagnosis not present

## 2019-11-07 DIAGNOSIS — K219 Gastro-esophageal reflux disease without esophagitis: Secondary | ICD-10-CM | POA: Diagnosis not present

## 2019-11-07 DIAGNOSIS — J449 Chronic obstructive pulmonary disease, unspecified: Secondary | ICD-10-CM | POA: Diagnosis not present

## 2019-11-07 DIAGNOSIS — Z79899 Other long term (current) drug therapy: Secondary | ICD-10-CM | POA: Diagnosis not present

## 2019-11-07 DIAGNOSIS — Z930 Tracheostomy status: Secondary | ICD-10-CM | POA: Diagnosis not present

## 2019-11-07 DIAGNOSIS — Z8521 Personal history of malignant neoplasm of larynx: Secondary | ICD-10-CM | POA: Diagnosis not present

## 2019-11-14 DIAGNOSIS — Z09 Encounter for follow-up examination after completed treatment for conditions other than malignant neoplasm: Secondary | ICD-10-CM | POA: Diagnosis not present

## 2019-11-14 DIAGNOSIS — Z8521 Personal history of malignant neoplasm of larynx: Secondary | ICD-10-CM | POA: Diagnosis not present

## 2019-11-14 DIAGNOSIS — Z923 Personal history of irradiation: Secondary | ICD-10-CM | POA: Diagnosis not present

## 2019-11-14 DIAGNOSIS — Z87891 Personal history of nicotine dependence: Secondary | ICD-10-CM | POA: Diagnosis not present

## 2019-11-20 ENCOUNTER — Ambulatory Visit: Payer: Medicare Other | Admitting: Neurology

## 2019-11-23 DIAGNOSIS — Z8521 Personal history of malignant neoplasm of larynx: Secondary | ICD-10-CM | POA: Diagnosis not present

## 2019-11-23 DIAGNOSIS — Z923 Personal history of irradiation: Secondary | ICD-10-CM | POA: Diagnosis not present

## 2019-11-23 DIAGNOSIS — Z930 Tracheostomy status: Secondary | ICD-10-CM | POA: Diagnosis not present

## 2019-12-05 DIAGNOSIS — Z930 Tracheostomy status: Secondary | ICD-10-CM | POA: Diagnosis not present

## 2019-12-05 DIAGNOSIS — C329 Malignant neoplasm of larynx, unspecified: Secondary | ICD-10-CM | POA: Diagnosis not present

## 2019-12-05 DIAGNOSIS — J449 Chronic obstructive pulmonary disease, unspecified: Secondary | ICD-10-CM | POA: Diagnosis not present

## 2019-12-13 ENCOUNTER — Other Ambulatory Visit: Payer: Self-pay

## 2019-12-13 MED ORDER — TRIAMCINOLONE ACETONIDE 0.5 % EX CREA: 1.0000 "application " | TOPICAL_CREAM | Freq: Three times a day (TID) | CUTANEOUS | 0 refills | Status: DC | PRN

## 2019-12-13 MED ORDER — AMPHETAMINE-DEXTROAMPHETAMINE 10 MG PO TABS: ORAL_TABLET | ORAL | 0 refills | Status: DC

## 2019-12-31 DIAGNOSIS — G893 Neoplasm related pain (acute) (chronic): Secondary | ICD-10-CM | POA: Diagnosis not present

## 2019-12-31 DIAGNOSIS — Z8521 Personal history of malignant neoplasm of larynx: Secondary | ICD-10-CM | POA: Diagnosis not present

## 2019-12-31 DIAGNOSIS — J386 Stenosis of larynx: Secondary | ICD-10-CM | POA: Diagnosis not present

## 2019-12-31 DIAGNOSIS — H469 Unspecified optic neuritis: Secondary | ICD-10-CM | POA: Diagnosis not present

## 2019-12-31 DIAGNOSIS — Z79899 Other long term (current) drug therapy: Secondary | ICD-10-CM | POA: Diagnosis not present

## 2019-12-31 DIAGNOSIS — Z9221 Personal history of antineoplastic chemotherapy: Secondary | ICD-10-CM | POA: Diagnosis not present

## 2019-12-31 DIAGNOSIS — R001 Bradycardia, unspecified: Secondary | ICD-10-CM | POA: Diagnosis not present

## 2019-12-31 DIAGNOSIS — M5416 Radiculopathy, lumbar region: Secondary | ICD-10-CM | POA: Diagnosis not present

## 2019-12-31 DIAGNOSIS — C329 Malignant neoplasm of larynx, unspecified: Secondary | ICD-10-CM | POA: Diagnosis not present

## 2019-12-31 DIAGNOSIS — J449 Chronic obstructive pulmonary disease, unspecified: Secondary | ICD-10-CM | POA: Diagnosis not present

## 2019-12-31 DIAGNOSIS — Z923 Personal history of irradiation: Secondary | ICD-10-CM | POA: Diagnosis not present

## 2019-12-31 DIAGNOSIS — N179 Acute kidney failure, unspecified: Secondary | ICD-10-CM | POA: Diagnosis not present

## 2019-12-31 DIAGNOSIS — Z430 Encounter for attention to tracheostomy: Secondary | ICD-10-CM | POA: Diagnosis not present

## 2019-12-31 DIAGNOSIS — Z5181 Encounter for therapeutic drug level monitoring: Secondary | ICD-10-CM | POA: Diagnosis not present

## 2019-12-31 DIAGNOSIS — I5042 Chronic combined systolic (congestive) and diastolic (congestive) heart failure: Secondary | ICD-10-CM | POA: Diagnosis not present

## 2019-12-31 DIAGNOSIS — Z79891 Long term (current) use of opiate analgesic: Secondary | ICD-10-CM | POA: Diagnosis not present

## 2019-12-31 DIAGNOSIS — I493 Ventricular premature depolarization: Secondary | ICD-10-CM | POA: Diagnosis not present

## 2019-12-31 DIAGNOSIS — Z888 Allergy status to other drugs, medicaments and biological substances status: Secondary | ICD-10-CM | POA: Diagnosis not present

## 2019-12-31 DIAGNOSIS — I471 Supraventricular tachycardia: Secondary | ICD-10-CM | POA: Diagnosis not present

## 2019-12-31 DIAGNOSIS — G8918 Other acute postprocedural pain: Secondary | ICD-10-CM | POA: Diagnosis not present

## 2019-12-31 DIAGNOSIS — J387 Other diseases of larynx: Secondary | ICD-10-CM | POA: Diagnosis not present

## 2019-12-31 DIAGNOSIS — G35 Multiple sclerosis: Secondary | ICD-10-CM | POA: Diagnosis not present

## 2019-12-31 DIAGNOSIS — K219 Gastro-esophageal reflux disease without esophagitis: Secondary | ICD-10-CM | POA: Diagnosis not present

## 2019-12-31 DIAGNOSIS — R270 Ataxia, unspecified: Secondary | ICD-10-CM | POA: Diagnosis not present

## 2019-12-31 DIAGNOSIS — I10 Essential (primary) hypertension: Secondary | ICD-10-CM | POA: Diagnosis not present

## 2019-12-31 DIAGNOSIS — Z9002 Acquired absence of larynx: Secondary | ICD-10-CM | POA: Diagnosis not present

## 2019-12-31 DIAGNOSIS — I5020 Unspecified systolic (congestive) heart failure: Secondary | ICD-10-CM | POA: Diagnosis not present

## 2019-12-31 DIAGNOSIS — G894 Chronic pain syndrome: Secondary | ICD-10-CM | POA: Diagnosis not present

## 2019-12-31 DIAGNOSIS — D696 Thrombocytopenia, unspecified: Secondary | ICD-10-CM | POA: Diagnosis not present

## 2019-12-31 DIAGNOSIS — R000 Tachycardia, unspecified: Secondary | ICD-10-CM | POA: Diagnosis not present

## 2019-12-31 DIAGNOSIS — D62 Acute posthemorrhagic anemia: Secondary | ICD-10-CM | POA: Diagnosis not present

## 2019-12-31 DIAGNOSIS — Z20822 Contact with and (suspected) exposure to covid-19: Secondary | ICD-10-CM | POA: Diagnosis not present

## 2019-12-31 DIAGNOSIS — Z01818 Encounter for other preprocedural examination: Secondary | ICD-10-CM | POA: Diagnosis not present

## 2020-01-01 DIAGNOSIS — Z5181 Encounter for therapeutic drug level monitoring: Secondary | ICD-10-CM | POA: Diagnosis not present

## 2020-01-01 DIAGNOSIS — Z79891 Long term (current) use of opiate analgesic: Secondary | ICD-10-CM | POA: Diagnosis not present

## 2020-01-14 DIAGNOSIS — Z9002 Acquired absence of larynx: Secondary | ICD-10-CM | POA: Diagnosis not present

## 2020-01-14 DIAGNOSIS — G8918 Other acute postprocedural pain: Secondary | ICD-10-CM | POA: Diagnosis not present

## 2020-01-14 DIAGNOSIS — G893 Neoplasm related pain (acute) (chronic): Secondary | ICD-10-CM | POA: Diagnosis not present

## 2020-01-14 DIAGNOSIS — C329 Malignant neoplasm of larynx, unspecified: Secondary | ICD-10-CM | POA: Diagnosis not present

## 2020-01-14 DIAGNOSIS — Z930 Tracheostomy status: Secondary | ICD-10-CM | POA: Diagnosis not present

## 2020-01-16 DIAGNOSIS — Z9002 Acquired absence of larynx: Secondary | ICD-10-CM | POA: Diagnosis not present

## 2020-01-16 DIAGNOSIS — Z923 Personal history of irradiation: Secondary | ICD-10-CM | POA: Diagnosis not present

## 2020-01-16 DIAGNOSIS — Z8521 Personal history of malignant neoplasm of larynx: Secondary | ICD-10-CM | POA: Diagnosis not present

## 2020-01-16 DIAGNOSIS — R491 Aphonia: Secondary | ICD-10-CM | POA: Diagnosis not present

## 2020-01-24 DIAGNOSIS — G8918 Other acute postprocedural pain: Secondary | ICD-10-CM | POA: Diagnosis not present

## 2020-02-01 DIAGNOSIS — Z923 Personal history of irradiation: Secondary | ICD-10-CM | POA: Diagnosis not present

## 2020-02-01 DIAGNOSIS — C329 Malignant neoplasm of larynx, unspecified: Secondary | ICD-10-CM | POA: Diagnosis not present

## 2020-02-01 DIAGNOSIS — R491 Aphonia: Secondary | ICD-10-CM | POA: Diagnosis not present

## 2020-02-01 DIAGNOSIS — Z930 Tracheostomy status: Secondary | ICD-10-CM | POA: Diagnosis not present

## 2020-02-01 DIAGNOSIS — Z9002 Acquired absence of larynx: Secondary | ICD-10-CM | POA: Diagnosis not present

## 2020-02-01 DIAGNOSIS — G893 Neoplasm related pain (acute) (chronic): Secondary | ICD-10-CM | POA: Diagnosis not present

## 2020-02-01 DIAGNOSIS — M542 Cervicalgia: Secondary | ICD-10-CM | POA: Diagnosis not present

## 2020-02-01 DIAGNOSIS — Z79899 Other long term (current) drug therapy: Secondary | ICD-10-CM | POA: Diagnosis not present

## 2020-02-01 DIAGNOSIS — G8918 Other acute postprocedural pain: Secondary | ICD-10-CM | POA: Diagnosis not present

## 2020-02-01 DIAGNOSIS — Z87891 Personal history of nicotine dependence: Secondary | ICD-10-CM | POA: Diagnosis not present

## 2020-02-14 MED ORDER — AMPHETAMINE-DEXTROAMPHETAMINE 10 MG PO TABS: ORAL_TABLET | ORAL | 0 refills | Status: DC

## 2020-02-15 DIAGNOSIS — G894 Chronic pain syndrome: Secondary | ICD-10-CM | POA: Diagnosis not present

## 2020-02-15 DIAGNOSIS — C329 Malignant neoplasm of larynx, unspecified: Secondary | ICD-10-CM | POA: Diagnosis not present

## 2020-02-15 DIAGNOSIS — Z5181 Encounter for therapeutic drug level monitoring: Secondary | ICD-10-CM | POA: Diagnosis not present

## 2020-02-15 DIAGNOSIS — G893 Neoplasm related pain (acute) (chronic): Secondary | ICD-10-CM | POA: Diagnosis not present

## 2020-02-15 DIAGNOSIS — Z79891 Long term (current) use of opiate analgesic: Secondary | ICD-10-CM | POA: Diagnosis not present

## 2020-02-15 DIAGNOSIS — Z9002 Acquired absence of larynx: Secondary | ICD-10-CM | POA: Diagnosis not present

## 2020-02-15 DIAGNOSIS — G8918 Other acute postprocedural pain: Secondary | ICD-10-CM | POA: Diagnosis not present

## 2020-02-29 DIAGNOSIS — G894 Chronic pain syndrome: Secondary | ICD-10-CM | POA: Diagnosis not present

## 2020-02-29 DIAGNOSIS — Z9002 Acquired absence of larynx: Secondary | ICD-10-CM | POA: Diagnosis not present

## 2020-02-29 DIAGNOSIS — G8918 Other acute postprocedural pain: Secondary | ICD-10-CM | POA: Diagnosis not present

## 2020-02-29 DIAGNOSIS — Z5181 Encounter for therapeutic drug level monitoring: Secondary | ICD-10-CM | POA: Diagnosis not present

## 2020-02-29 DIAGNOSIS — Z79891 Long term (current) use of opiate analgesic: Secondary | ICD-10-CM | POA: Diagnosis not present

## 2020-03-06 DIAGNOSIS — I5030 Unspecified diastolic (congestive) heart failure: Secondary | ICD-10-CM | POA: Diagnosis not present

## 2020-03-06 DIAGNOSIS — C329 Malignant neoplasm of larynx, unspecified: Secondary | ICD-10-CM | POA: Diagnosis not present

## 2020-03-06 DIAGNOSIS — I470 Re-entry ventricular arrhythmia: Secondary | ICD-10-CM | POA: Diagnosis not present

## 2020-03-06 DIAGNOSIS — J449 Chronic obstructive pulmonary disease, unspecified: Secondary | ICD-10-CM | POA: Diagnosis not present

## 2020-03-06 DIAGNOSIS — G35 Multiple sclerosis: Secondary | ICD-10-CM | POA: Diagnosis not present

## 2020-03-13 DIAGNOSIS — G893 Neoplasm related pain (acute) (chronic): Secondary | ICD-10-CM | POA: Diagnosis not present

## 2020-03-13 DIAGNOSIS — C329 Malignant neoplasm of larynx, unspecified: Secondary | ICD-10-CM | POA: Diagnosis not present

## 2020-03-13 DIAGNOSIS — Z79899 Other long term (current) drug therapy: Secondary | ICD-10-CM | POA: Diagnosis not present

## 2020-03-13 DIAGNOSIS — G894 Chronic pain syndrome: Secondary | ICD-10-CM | POA: Diagnosis not present

## 2020-03-13 DIAGNOSIS — Z1389 Encounter for screening for other disorder: Secondary | ICD-10-CM | POA: Diagnosis not present

## 2020-03-14 DIAGNOSIS — G893 Neoplasm related pain (acute) (chronic): Secondary | ICD-10-CM | POA: Diagnosis not present

## 2020-03-14 DIAGNOSIS — Z923 Personal history of irradiation: Secondary | ICD-10-CM | POA: Diagnosis not present

## 2020-03-14 DIAGNOSIS — C329 Malignant neoplasm of larynx, unspecified: Secondary | ICD-10-CM | POA: Diagnosis not present

## 2020-03-14 DIAGNOSIS — Z9002 Acquired absence of larynx: Secondary | ICD-10-CM | POA: Diagnosis not present

## 2020-03-14 DIAGNOSIS — R491 Aphonia: Secondary | ICD-10-CM | POA: Diagnosis not present

## 2020-03-14 DIAGNOSIS — Z8521 Personal history of malignant neoplasm of larynx: Secondary | ICD-10-CM | POA: Diagnosis not present

## 2020-03-20 DIAGNOSIS — C329 Malignant neoplasm of larynx, unspecified: Secondary | ICD-10-CM | POA: Diagnosis not present

## 2020-03-20 DIAGNOSIS — Z8521 Personal history of malignant neoplasm of larynx: Secondary | ICD-10-CM | POA: Diagnosis not present

## 2020-03-20 DIAGNOSIS — R491 Aphonia: Secondary | ICD-10-CM | POA: Diagnosis not present

## 2020-03-20 DIAGNOSIS — Z923 Personal history of irradiation: Secondary | ICD-10-CM | POA: Diagnosis not present

## 2020-03-20 DIAGNOSIS — Z9002 Acquired absence of larynx: Secondary | ICD-10-CM | POA: Diagnosis not present

## 2020-04-04 DIAGNOSIS — Z923 Personal history of irradiation: Secondary | ICD-10-CM | POA: Diagnosis not present

## 2020-04-04 DIAGNOSIS — C329 Malignant neoplasm of larynx, unspecified: Secondary | ICD-10-CM | POA: Diagnosis not present

## 2020-04-04 DIAGNOSIS — Z8521 Personal history of malignant neoplasm of larynx: Secondary | ICD-10-CM | POA: Diagnosis not present

## 2020-04-04 DIAGNOSIS — R491 Aphonia: Secondary | ICD-10-CM | POA: Diagnosis not present

## 2020-04-04 DIAGNOSIS — Z9002 Acquired absence of larynx: Secondary | ICD-10-CM | POA: Diagnosis not present

## 2020-04-08 DIAGNOSIS — Z79891 Long term (current) use of opiate analgesic: Secondary | ICD-10-CM | POA: Diagnosis not present

## 2020-04-08 DIAGNOSIS — G893 Neoplasm related pain (acute) (chronic): Secondary | ICD-10-CM | POA: Diagnosis not present

## 2020-04-08 DIAGNOSIS — Z5181 Encounter for therapeutic drug level monitoring: Secondary | ICD-10-CM | POA: Diagnosis not present

## 2020-04-09 ENCOUNTER — Encounter: Payer: Self-pay | Admitting: Neurology

## 2020-04-09 ENCOUNTER — Ambulatory Visit: Payer: Medicare Other | Admitting: Neurology

## 2020-04-09 VITALS — BP 131/71 | HR 73 | Ht 73.0 in | Wt 181.5 lb

## 2020-04-09 DIAGNOSIS — R5383 Other fatigue: Secondary | ICD-10-CM | POA: Diagnosis not present

## 2020-04-09 DIAGNOSIS — G35 Multiple sclerosis: Secondary | ICD-10-CM | POA: Diagnosis not present

## 2020-04-09 DIAGNOSIS — R269 Unspecified abnormalities of gait and mobility: Secondary | ICD-10-CM | POA: Diagnosis not present

## 2020-04-09 DIAGNOSIS — C329 Malignant neoplasm of larynx, unspecified: Secondary | ICD-10-CM

## 2020-04-09 DIAGNOSIS — R1310 Dysphagia, unspecified: Secondary | ICD-10-CM

## 2020-04-09 MED ORDER — AMPHETAMINE-DEXTROAMPHETAMINE 10 MG PO TABS: ORAL_TABLET | ORAL | 0 refills | Status: DC

## 2020-04-09 MED ORDER — CYCLOBENZAPRINE HCL 5 MG PO TABS: 5.0000 mg | ORAL_TABLET | Freq: Two times a day (BID) | ORAL | 11 refills | Status: DC | PRN

## 2020-04-09 MED ORDER — BUSPIRONE HCL 15 MG PO TABS: 15.0000 mg | ORAL_TABLET | Freq: Two times a day (BID) | ORAL | 11 refills | Status: DC

## 2020-04-09 NOTE — Progress Notes (Signed)
[facility]  PATIENT: Don Hamilton DOB: 02-27-1961  REFERRING DOCTOR OR PCP:  None SOURCE: patient, records from [facility].  _________________________________   HISTORICAL  CHIEF COMPLAINT:  Chief Complaint  Patient presents with  . Follow-up    RM 12, alone. Last seen 10/08/19. Ambulates with cane. Had 1 fall since last seen about two months ago. Hit head but did not get checked out. He has been able to gain 30 lb since last seen.   . Multiple Sclerosis    Off DMT.   [name] Throat cancer    Had Laryngectomy 01/02/20.  [name] Anxiety    Taking buspar  . Fatigue/Add    Takes adderall    HISTORY Don Hamilton is a 59 y.o. man with multiple sclerosis.   Update 04/09/2020 He is [facility]   He is doing fairly well but has had issues with the prosthetic throat.    He is able to speak and is eating better than last visit.     Currently, he is ambulating with a cane due to the gait be unstable.  He notes some weakness in his legs and mild spasticity.  Baclofen has helped.     Oxybutynin has helped his urinary frequency.  He notes a lot of fatigue and insomnia is also doing worse.  He gets some benefit from Adderall for the fatigue and needs a new refill.  He felt he did better with the immediate release Adderall than the time release.    He has insomnia helped by doxepin.  He has anxiety and depression .   He is on sertraline  He has lost 50 pounds.   He is able to swallow liquids, better than last year.   He has trouble with food.  He sees pain management in [place] (was seen [facility] while the cancer was actively being treated) he is on oxycodone 20 mg 5 times a day.  I will write the Adderall which has helped his fatigue and cognitive issues.   MS History:   He presented in 1998 with left optic neuritis. An MRI of the brain was consistent with multiple sclerosis. He was started on Betaseron but he stopped due to allergic reactions and injection site  reactions. Avonex was tried but since he stopped after a while due to rash. When Tysabri was reintroduced around 2006, he went on that for about a year or so. However, he would get a rash with each infusion. Therefore, it was discontinued and he was placed on off label Campath. He received his first year dose but had severe infusion reactions with rash that lasted over a month. Therefore, second year Campath was not performed. In 2015, he was placed on Aubagio. After month it was stopped due to diarrhea he has not been on Tecfidera. Gilenya was considered but he does have a cardiac arrhythmia. Last year, Dr. Alton Revere placed him on Copaxone but he decided not to take it as it is generally weaker than other medications he has been on.  He received 5 days Mavenclad in October/November 2019.     Due to side effects, with fatigue and rash did not receive a second course 1 month later   REVIEW OF SYSTEMS: Constitutional: No fevers, chills, sweats, or change in appetite.   He has fatigue and insomnia Eyes: No visual changes, double vision, eye pain Ear, nose and throat: No hearing loss, ear pain, nasal congestion, sore throat Cardiovascular: No chest pain, palpitations Respiratory: No shortness  of breath at rest or with exertion.   No wheezes GastrointestinaI: No nausea, vomiting, diarrhea, abdominal pain, fecal incontinence Genitourinary:He has urinary frequency.. Musculoskeletal: No neck pain.  Notes right hip > back pain Integumentary: No rash, pruritus, skin lesions Neurological: as above Psychiatric: Currently denies depression or anxiety. Endocrine: No palpitations, diaphoresis, change in appetite, change in weigh or increased thirst Hematologic/Lymphatic: No anemia, purpura, petechiae. Allergic/Immunologic: No itchy/runny eyes, nasal congestion, recent allergic reactions, rashes  ALLERGIES: Allergies  Allergen Reactions  . Campath [Alemtuzumab] Hives  . Mavenclad [Cladribine]     Flu-like  sx  . Avonex [Interferon Beta-1a] Rash  . Betaseron [Interferon Beta-1b] Rash  . Tysabri [Natalizumab] Rash    HOME MEDICATIONS:  Current Outpatient Medications:  .  amphetamine-dextroamphetamine (ADDERALL) 10 MG tablet, One po qAm and one po qnoon, Disp: 60 tablet, Rfl: 0 .  baclofen (LIORESAL) 10 MG tablet, Take 1 tablet (10 mg total) by mouth 4 (four) times daily., Disp: 120 tablet, Rfl: 11 .  busPIRone (BUSPAR) 15 MG tablet, Take 1 tablet (15 mg total) by mouth 2 (two) times daily., Disp: 60 tablet, Rfl: 5 .  cyclobenzaprine (FLEXERIL) 5 MG tablet, Take 1 tablet (5 mg total) by mouth 2 (two) times daily as needed for muscle spasms., Disp: 60 tablet, Rfl: 5 .  doxepin (SINEQUAN) 10 MG capsule, Take 1 capsule (10 mg total) by mouth at bedtime., Disp: 30 capsule, Rfl: 11 .  furosemide (LASIX) 40 MG tablet, Take 1 tablet (40 mg total) by mouth daily., Disp: 90 tablet, Rfl: 1 .  gabapentin (NEURONTIN) 300 MG capsule, TAKE 1 CAPSULE BY MOUTH THREE TIMES A DAY, Disp: 90 capsule, Rfl: 11 .  hydrOXYzine (ATARAX/VISTARIL) 10 MG tablet, Take 1 tablet (10 mg total) by mouth 3 (three) times daily as needed., Disp: 90 tablet, Rfl: 5 .  oxybutynin (DITROPAN) 5 MG tablet, Take 1 tablet (5 mg total) by mouth 2 (two) times daily as needed for bladder spasms., Disp: 60 tablet, Rfl: 11 .  Oxycodone HCl 20 MG TABS, Take 1 tablet by mouth every 4 (four) hours as needed., Disp: , Rfl:  .  potassium chloride (KLOR-CON 10) 10 MEQ tablet, Take 1 tablet (10 mEq total) by mouth daily., Disp: 90 tablet, Rfl: 2 .  triamcinolone cream (KENALOG) 0.5 %, Apply 1 application topically 3 (three) times daily as needed., Disp: 30 g, Rfl: 0  PAST MEDICAL HISTORY: Past Medical History:  Diagnosis Date  . Atrial tachycardia (HCC)   . Basal cell carcinoma of face   . Cancer (HCC)   . Headache   . Multiple sclerosis (HCC)   . Vision abnormalities     PAST SURGICAL HISTORY: Past Surgical History:  Procedure Laterality  Date  . CARDIAC ELECTROPHYSIOLOGY MAPPING AND ABLATION    . SKIN CANCER EXCISION  2016   nose  . TONSILLECTOMY    . TUMOR REMOVAL Left 09/22/2018   tumor removed from left vocal cord    FAMILY HISTORY: Family History  Problem Relation Age of Onset  . Healthy Mother   . Stroke Father   . Heart attack Father   . Leukemia Father     SOCIAL HISTORY:  Social History   Socioeconomic History  . Marital status: Married    Spouse name: Not on file  . Number of children: Not on file  . Years of education: Not on file  . Highest education level: Not on file  Occupational History  . Not on file  Tobacco Use  .  Smoking status: Current Every Day Smoker    Types: Cigarettes    Start date: 67  . Smokeless tobacco: Never Used  . Tobacco comment: 4 cigarettes/day  Vaping Use  . Vaping Use: Never used  Substance and Sexual Activity  . Alcohol use: Yes    Alcohol/week: 0.0 standard drinks    Comment: occasional beer  . Drug use: No  . Sexual activity: Not on file  Other Topics Concern  . Not on file  Social History Narrative  . Not on file   Social Determinants of Health   Financial Resource Strain:   . Difficulty of Paying Living Expenses: Not on file  Food Insecurity:   . Worried About Programme researcher, broadcasting/film/video in the Last Year: Not on file  . Ran Out of Food in the Last Year: Not on file  Transportation Needs:   . Lack of Transportation (Medical): Not on file  . Lack of Transportation (Non-Medical): Not on file  Physical Activity:   . Days of Exercise per Week: Not on file  . Minutes of Exercise per Session: Not on file  Stress:   . Feeling of Stress : Not on file  Social Connections:   . Frequency of Communication with Friends and Family: Not on file  . Frequency of Social Gatherings with Friends and Family: Not on file  . Attends Religious Services: Not on file  . Active Member of Clubs or Organizations: Not on file  . Attends Banker Meetings: Not on  file  . Marital Status: Not on file  Intimate Partner Violence:   . Fear of Current or Ex-Partner: Not on file  . Emotionally Abused: Not on file  . Physically Abused: Not on file  . Sexually Abused: Not on file     PHYSICAL EXAM  Vitals:   04/09/20 1134  BP: 131/71  Pulse: 73  Weight: 181 lb 8 oz (82.3 kg)  Height: 6\' 1"  (1.854 m)    Body mass index is 23.95 kg/m.   General: The patient is well-developed and well-nourished and in no acute distress.   He has mild edema at ankles.  Status post tracheostomy     Neurologic Exam  Mental status: The patient is alert and oriented x 3 at the time of the examination. The patient has apparent normal recent and remote memory,  No aphasia..  Cranial nerves: Extraocular movements are full.    Facial strength and sensation is normal. Trapezius strength is strong.. No obvious hearing deficits are noted.  Motor:  Muscle bulk is normal.  Muscle tone is increased in the legs. Strength is 4/5 in the right hip flexors and right toe and ankle extensors..   Sensory: Sensory testing shows reduced sensation in right foot.  Coordination: Finger-nose-finger is performed well. Heel-to-shin is performed poorly on the right (slightly reduced left)   Gait and station: Station is normal.  Gait is wide and arthritic with mild right leg drop.  The tandem gait is wide.  Romberg is negative.       Reflexes: Deep tendon reflexes are symmetric and increased in legs , R>L. He has crossed adductor responses.    No ankle clonus plantar responses are flexor.      ASSESSMENT AND PLAN  No diagnosis found.   1.   Will stay off of an MS DMT at this time.  He has had trouble tolerating multiple different medications.   He would likely have an extended benefit from the [facility] lasting  several years. 2.   Stay active and exercise as tolerated.  Continue to try to eat well.   3.   Continue buspirone,  If anxiety worsens, consider changing buspirone to Abilify.    4.   Renew other medi's.  renew Adderall for attention deficit and MS fatigue.    He sees Earley Abide for Pain management in [place] and is on oxycodone.     5.   Return to see me in 6 months or sooner if there are new or worsening neurologic symptoms.    Kaly Mcquary A. Epimenio Foot, MD, PhD 04/09/2020, 12:00 PM Certified in Neurology, Clinical Neurophysiology, Sleep Medicine, Pain Medicine and Neuroimaging  [facility] [address] [place], [place] [address] [phone]

## 2020-05-07 DIAGNOSIS — Z79891 Long term (current) use of opiate analgesic: Secondary | ICD-10-CM | POA: Diagnosis not present

## 2020-05-07 DIAGNOSIS — G893 Neoplasm related pain (acute) (chronic): Secondary | ICD-10-CM | POA: Diagnosis not present

## 2020-05-07 DIAGNOSIS — Z5181 Encounter for therapeutic drug level monitoring: Secondary | ICD-10-CM | POA: Diagnosis not present

## 2020-06-05 DIAGNOSIS — G893 Neoplasm related pain (acute) (chronic): Secondary | ICD-10-CM | POA: Diagnosis not present

## 2020-06-05 DIAGNOSIS — Z5181 Encounter for therapeutic drug level monitoring: Secondary | ICD-10-CM | POA: Diagnosis not present

## 2020-06-05 DIAGNOSIS — Z79891 Long term (current) use of opiate analgesic: Secondary | ICD-10-CM | POA: Diagnosis not present

## 2020-07-09 DIAGNOSIS — R197 Diarrhea, unspecified: Secondary | ICD-10-CM | POA: Diagnosis not present

## 2020-07-09 DIAGNOSIS — R12 Heartburn: Secondary | ICD-10-CM | POA: Diagnosis not present

## 2020-07-09 DIAGNOSIS — R1310 Dysphagia, unspecified: Secondary | ICD-10-CM | POA: Diagnosis not present

## 2020-07-10 DIAGNOSIS — Z8521 Personal history of malignant neoplasm of larynx: Secondary | ICD-10-CM | POA: Diagnosis not present

## 2020-07-10 DIAGNOSIS — Z923 Personal history of irradiation: Secondary | ICD-10-CM | POA: Diagnosis not present

## 2020-07-10 DIAGNOSIS — R491 Aphonia: Secondary | ICD-10-CM | POA: Diagnosis not present

## 2020-07-10 DIAGNOSIS — B370 Candidal stomatitis: Secondary | ICD-10-CM | POA: Diagnosis not present

## 2020-07-10 DIAGNOSIS — Z9002 Acquired absence of larynx: Secondary | ICD-10-CM | POA: Diagnosis not present

## 2020-07-15 DIAGNOSIS — D123 Benign neoplasm of transverse colon: Secondary | ICD-10-CM | POA: Diagnosis not present

## 2020-07-15 DIAGNOSIS — K648 Other hemorrhoids: Secondary | ICD-10-CM | POA: Diagnosis not present

## 2020-07-15 DIAGNOSIS — Z963 Presence of artificial larynx: Secondary | ICD-10-CM | POA: Diagnosis not present

## 2020-07-15 DIAGNOSIS — K635 Polyp of colon: Secondary | ICD-10-CM | POA: Diagnosis not present

## 2020-07-15 DIAGNOSIS — R1310 Dysphagia, unspecified: Secondary | ICD-10-CM | POA: Diagnosis not present

## 2020-07-15 DIAGNOSIS — K209 Esophagitis, unspecified without bleeding: Secondary | ICD-10-CM | POA: Diagnosis not present

## 2020-07-15 DIAGNOSIS — K219 Gastro-esophageal reflux disease without esophagitis: Secondary | ICD-10-CM | POA: Diagnosis not present

## 2020-07-15 DIAGNOSIS — Z8512 Personal history of malignant neoplasm of trachea: Secondary | ICD-10-CM | POA: Diagnosis not present

## 2020-07-15 DIAGNOSIS — R197 Diarrhea, unspecified: Secondary | ICD-10-CM | POA: Diagnosis not present

## 2020-07-15 DIAGNOSIS — K52839 Microscopic colitis, unspecified: Secondary | ICD-10-CM | POA: Diagnosis not present

## 2020-07-15 DIAGNOSIS — J449 Chronic obstructive pulmonary disease, unspecified: Secondary | ICD-10-CM | POA: Diagnosis not present

## 2020-07-15 DIAGNOSIS — G35 Multiple sclerosis: Secondary | ICD-10-CM | POA: Diagnosis not present

## 2020-07-15 DIAGNOSIS — F17210 Nicotine dependence, cigarettes, uncomplicated: Secondary | ICD-10-CM | POA: Diagnosis not present

## 2020-07-23 DIAGNOSIS — M12812 Other specific arthropathies, not elsewhere classified, left shoulder: Secondary | ICD-10-CM | POA: Diagnosis not present

## 2020-07-23 DIAGNOSIS — M75102 Unspecified rotator cuff tear or rupture of left shoulder, not specified as traumatic: Secondary | ICD-10-CM | POA: Diagnosis not present

## 2020-07-23 DIAGNOSIS — Z0000 Encounter for general adult medical examination without abnormal findings: Secondary | ICD-10-CM | POA: Diagnosis not present

## 2020-07-31 DIAGNOSIS — M129 Arthropathy, unspecified: Secondary | ICD-10-CM | POA: Diagnosis not present

## 2020-07-31 DIAGNOSIS — M75102 Unspecified rotator cuff tear or rupture of left shoulder, not specified as traumatic: Secondary | ICD-10-CM | POA: Diagnosis not present

## 2020-07-31 DIAGNOSIS — M12812 Other specific arthropathies, not elsewhere classified, left shoulder: Secondary | ICD-10-CM | POA: Diagnosis not present

## 2020-08-04 ENCOUNTER — Other Ambulatory Visit: Payer: Self-pay | Admitting: *Deleted

## 2020-08-04 MED ORDER — AMPHETAMINE-DEXTROAMPHETAMINE 10 MG PO TABS: ORAL_TABLET | ORAL | 0 refills | Status: DC

## 2020-08-05 DIAGNOSIS — M79602 Pain in left arm: Secondary | ICD-10-CM | POA: Diagnosis not present

## 2020-08-05 DIAGNOSIS — G893 Neoplasm related pain (acute) (chronic): Secondary | ICD-10-CM | POA: Diagnosis not present

## 2020-08-05 DIAGNOSIS — M25512 Pain in left shoulder: Secondary | ICD-10-CM | POA: Diagnosis not present

## 2020-08-05 DIAGNOSIS — G8929 Other chronic pain: Secondary | ICD-10-CM | POA: Diagnosis not present

## 2020-08-05 DIAGNOSIS — Z79891 Long term (current) use of opiate analgesic: Secondary | ICD-10-CM | POA: Diagnosis not present

## 2020-08-05 DIAGNOSIS — Z5181 Encounter for therapeutic drug level monitoring: Secondary | ICD-10-CM | POA: Diagnosis not present

## 2020-08-15 DIAGNOSIS — M7552 Bursitis of left shoulder: Secondary | ICD-10-CM | POA: Diagnosis not present

## 2020-08-27 DIAGNOSIS — E876 Hypokalemia: Secondary | ICD-10-CM | POA: Diagnosis not present

## 2020-08-27 DIAGNOSIS — R42 Dizziness and giddiness: Secondary | ICD-10-CM | POA: Diagnosis not present

## 2020-08-27 DIAGNOSIS — C329 Malignant neoplasm of larynx, unspecified: Secondary | ICD-10-CM | POA: Diagnosis not present

## 2020-08-27 DIAGNOSIS — R0602 Shortness of breath: Secondary | ICD-10-CM | POA: Diagnosis not present

## 2020-09-15 DIAGNOSIS — Z930 Tracheostomy status: Secondary | ICD-10-CM | POA: Diagnosis not present

## 2020-09-15 DIAGNOSIS — I700 Atherosclerosis of aorta: Secondary | ICD-10-CM | POA: Diagnosis not present

## 2020-09-15 DIAGNOSIS — I5030 Unspecified diastolic (congestive) heart failure: Secondary | ICD-10-CM | POA: Diagnosis not present

## 2020-09-15 DIAGNOSIS — I470 Re-entry ventricular arrhythmia: Secondary | ICD-10-CM | POA: Diagnosis not present

## 2020-09-15 DIAGNOSIS — Z0000 Encounter for general adult medical examination without abnormal findings: Secondary | ICD-10-CM | POA: Diagnosis not present

## 2020-09-15 DIAGNOSIS — J449 Chronic obstructive pulmonary disease, unspecified: Secondary | ICD-10-CM | POA: Diagnosis not present

## 2020-09-15 DIAGNOSIS — K219 Gastro-esophageal reflux disease without esophagitis: Secondary | ICD-10-CM | POA: Diagnosis not present

## 2020-09-15 DIAGNOSIS — G35 Multiple sclerosis: Secondary | ICD-10-CM | POA: Diagnosis not present

## 2020-09-15 DIAGNOSIS — C329 Malignant neoplasm of larynx, unspecified: Secondary | ICD-10-CM | POA: Diagnosis not present

## 2020-10-01 ENCOUNTER — Telehealth: Payer: Self-pay | Admitting: Neurology

## 2020-10-01 DIAGNOSIS — Z5181 Encounter for therapeutic drug level monitoring: Secondary | ICD-10-CM | POA: Diagnosis not present

## 2020-10-01 DIAGNOSIS — Z79891 Long term (current) use of opiate analgesic: Secondary | ICD-10-CM | POA: Diagnosis not present

## 2020-10-01 DIAGNOSIS — G893 Neoplasm related pain (acute) (chronic): Secondary | ICD-10-CM | POA: Diagnosis not present

## 2020-10-01 NOTE — Telephone Encounter (Signed)
Unable to contact pt to r/s 4/7 appointment (MD out). Left vm on home #, no vm option for cell #. Please r/s patient if he calls back.

## 2020-10-02 DIAGNOSIS — R491 Aphonia: Secondary | ICD-10-CM | POA: Diagnosis not present

## 2020-10-02 DIAGNOSIS — Z8521 Personal history of malignant neoplasm of larynx: Secondary | ICD-10-CM | POA: Diagnosis not present

## 2020-10-02 DIAGNOSIS — Z9002 Acquired absence of larynx: Secondary | ICD-10-CM | POA: Diagnosis not present

## 2020-10-09 ENCOUNTER — Ambulatory Visit: Payer: Medicare Other | Admitting: Neurology

## 2020-10-21 ENCOUNTER — Other Ambulatory Visit: Payer: Self-pay | Admitting: Neurology

## 2020-10-22 ENCOUNTER — Other Ambulatory Visit: Payer: Self-pay | Admitting: *Deleted

## 2020-10-27 ENCOUNTER — Ambulatory Visit: Payer: Medicare Other | Admitting: Neurology

## 2020-10-27 ENCOUNTER — Telehealth: Payer: Self-pay | Admitting: Neurology

## 2020-10-27 NOTE — Telephone Encounter (Signed)
Don Hamilton pt's ride at the moment called regarding the pt's appointment for today. She was informed that this appt had been cx due to the provider being out. Pt and Don were quite upset and stated that they did not receive a message about this cx and they have driven two hours to make this appt. Pt has given permission for RN to speak to Don about r/s his appt. He states that he needs to see Only Dr Felecia Shelling and soon due to his "MS being off the charts" Please advise.

## 2020-10-27 NOTE — Telephone Encounter (Signed)
Hey, do you mind calling and offering appt for tomorrow with Dr. Felecia Shelling at 1:00pm? Thank you

## 2020-10-28 ENCOUNTER — Ambulatory Visit: Payer: Medicare Other | Admitting: Neurology

## 2020-10-28 ENCOUNTER — Telehealth: Payer: Self-pay | Admitting: Neurology

## 2020-10-28 ENCOUNTER — Encounter: Payer: Self-pay | Admitting: Neurology

## 2020-10-28 VITALS — BP 121/81 | HR 82 | Ht 73.0 in | Wt 176.5 lb

## 2020-10-28 DIAGNOSIS — G35 Multiple sclerosis: Secondary | ICD-10-CM | POA: Diagnosis not present

## 2020-10-28 DIAGNOSIS — R208 Other disturbances of skin sensation: Secondary | ICD-10-CM | POA: Diagnosis not present

## 2020-10-28 DIAGNOSIS — R269 Unspecified abnormalities of gait and mobility: Secondary | ICD-10-CM

## 2020-10-28 DIAGNOSIS — C329 Malignant neoplasm of larynx, unspecified: Secondary | ICD-10-CM

## 2020-10-28 DIAGNOSIS — E559 Vitamin D deficiency, unspecified: Secondary | ICD-10-CM | POA: Diagnosis not present

## 2020-10-28 MED ORDER — GABAPENTIN 300 MG PO CAPS: ORAL_CAPSULE | ORAL | 5 refills | Status: DC

## 2020-10-28 MED ORDER — BUPROPION HCL ER (XL) 300 MG PO TB24: 300.0000 mg | ORAL_TABLET | Freq: Every day | ORAL | 5 refills | Status: DC

## 2020-10-28 MED ORDER — AMPHETAMINE-DEXTROAMPHETAMINE 10 MG PO TABS: ORAL_TABLET | ORAL | 0 refills | Status: DC

## 2020-10-28 NOTE — Progress Notes (Addendum)
[facility]  PATIENT: Don Hamilton DOB: February 19, 1961  REFERRING DOCTOR OR PCP:  None SOURCE: patient, records from [facility].  _________________________________   HISTORICAL  CHIEF COMPLAINT:  Chief Complaint  Patient presents with  . Follow-up    RM 13 w/ fiance, Don Hamilton. Last seen 04/09/2020. Off DMT for MS. Feels MS is worse within the last couple months. Having increased pain in left shoulder radiating down arm. Thought he had rotator cuff issue. Had Xray/MRI and neg. Has tried prednisone/pain meds but both ineffective. Also having pain on outside of left thigh that is constant. He fell three weeks ago and hit head. Was on Cymbalta that caused dizziness/rash. He stopped this. Would like to discuss alternative for sertraline.     HISTORY Don Hamilton is a 60 y.o. man with multiple sclerosis.   Update 10/28/2020; He is noting more left sided neurologic symptom including left arm and leg pain starting 2 months ago (arm first).   He had a course of prednisone with no benefit.   He feels strength is unchanged though pain limits some activities.  He denies any bladder changes.    He feels gait is worse due to pain.     He is on Oxycodone 20 mg x 5  ([facility], [place]).    He is also on gabapentin 300 mg po tid.    He is on baclofen 10 mg po tid.   He takes flexeril at night.     He has vocal cord cancer.   He is seeing ENT at [facility].   He is doing fairly well but has had issues with the prosthetic throat.    He is able to speak and is eating better than last visit.     Currently, he is ambulating with a cane due to the gait be unstable.  He notes some weakness in his legs and mild spasticity.  Baclofen has helped.     Oxybutynin has helped his urinary frequency.  He notes a lot of fatigue and insomnia is also doing worse.  He gets some benefit from Adderall for the fatigue and needs a new refill.  He felt he did better with the immediate  release Adderall than the time release.    He has insomnia helped by doxepin.  He has anxiety and depression .   He is on sertraline.      He has lost 50 pounds.   He is able to swallow liquids, better than last year.   He has trouble with food.  He sees pain management in [place] (was seen [facility] while the cancer was actively being treated) he is on oxycodone 20 mg 5 times a day.  I will write the Adderall which has helped his fatigue and cognitive issues.   MS History:   He presented in 1998 with left optic neuritis. An MRI of the brain was consistent with multiple sclerosis. He was started on Betaseron but he stopped due to allergic reactions and injection site reactions. Avonex was tried but since he stopped after a while due to rash. When Tysabri was reintroduced around 2006, he went on that for about a year or so. However, he would get a rash with each infusion. Therefore, it was discontinued and he was placed on off label Campath. He received his first year dose but had severe infusion reactions with rash that lasted over a month. Therefore, second year Campath was not performed. In 2015, he was placed  on Aubagio. After month it was stopped due to diarrhea he has not been on Tecfidera. Gilenya was considered but he does have a cardiac arrhythmia. Last year, Dr. Alton Hamilton placed him on Copaxone but he decided not to take it as it is generally weaker than other medications he has been on.  He received 5 days Mavenclad in October/November 2019.     Due to side effects, with fatigue and rash did not receive a second course 1 month later   REVIEW OF SYSTEMS: Constitutional: No fevers, chills, sweats, or change in appetite.   He has fatigue and insomnia Eyes: No visual changes, double vision, eye pain Ear, nose and throat: No hearing loss, ear pain, nasal congestion, sore throat Cardiovascular: No chest pain, palpitations Respiratory: No shortness of breath at rest or with exertion.   No  wheezes GastrointestinaI: No nausea, vomiting, diarrhea, abdominal pain, fecal incontinence Genitourinary:He has urinary frequency.. Musculoskeletal: No neck pain.  Notes right hip > back pain Integumentary: No rash, pruritus, skin lesions Neurological: as above Psychiatric: Currently denies depression or anxiety. Endocrine: No palpitations, diaphoresis, change in appetite, change in weigh or increased thirst Hematologic/Lymphatic: No anemia, purpura, petechiae. Allergic/Immunologic: No itchy/runny eyes, nasal congestion, recent allergic reactions, rashes  ALLERGIES: Allergies  Allergen Reactions  . Campath [Alemtuzumab] Hives  . Mavenclad [Cladribine]     Flu-like sx  . Avonex [Interferon Beta-1a] Rash  . Betaseron [Interferon Beta-1b] Rash  . Duloxetine Rash    Falls, dizziness  . Tysabri [Natalizumab] Rash    HOME MEDICATIONS:  Current Outpatient Medications:  .  baclofen (LIORESAL) 10 MG tablet, Take 1 tablet (10 mg total) by mouth 4 (four) times daily., Disp: 120 tablet, Rfl: 11 .  buPROPion (WELLBUTRIN XL) 300 MG 24 hr tablet, Take 1 tablet (300 mg total) by mouth daily., Disp: 30 tablet, Rfl: 5 .  busPIRone (BUSPAR) 15 MG tablet, Take 1 tablet (15 mg total) by mouth 2 (two) times daily., Disp: 60 tablet, Rfl: 11 .  cyclobenzaprine (FLEXERIL) 5 MG tablet, Take 1 tablet (5 mg total) by mouth 2 (two) times daily as needed for muscle spasms., Disp: 60 tablet, Rfl: 11 .  furosemide (LASIX) 40 MG tablet, Take 1 tablet (40 mg total) by mouth daily., Disp: 90 tablet, Rfl: 1 .  hydrOXYzine (ATARAX/VISTARIL) 10 MG tablet, Take 1 tablet (10 mg total) by mouth 3 (three) times daily as needed., Disp: 90 tablet, Rfl: 5 .  oxybutynin (DITROPAN) 5 MG tablet, Take 1 tablet (5 mg total) by mouth 2 (two) times daily as needed for bladder spasms., Disp: 60 tablet, Rfl: 11 .  Oxycodone HCl 20 MG TABS, Take 1 tablet by mouth every 4 (four) hours as needed., Disp: , Rfl:  .  sertraline (ZOLOFT)  50 MG tablet, Take 1.5 tablets by mouth at bedtime., Disp: , Rfl:  .  triamcinolone cream (KENALOG) 0.5 %, Apply 1 application topically 3 (three) times daily as needed., Disp: 30 g, Rfl: 0 .  amphetamine-dextroamphetamine (ADDERALL) 10 MG tablet, One po qAm and one po qnoon, Disp: 60 tablet, Rfl: 0 .  gabapentin (NEURONTIN) 300 MG capsule, TAKE 1 CAPSULE BY MOUTH FIVE TIMES A DAY., Disp: 150 capsule, Rfl: 5  PAST MEDICAL HISTORY: Past Medical History:  Diagnosis Date  . Atrial tachycardia (HCC)   . Basal cell carcinoma of face   . Cancer (HCC)   . Headache   . Multiple sclerosis (HCC)   . Vision abnormalities     PAST SURGICAL HISTORY: Past  Surgical History:  Procedure Laterality Date  . CARDIAC ELECTROPHYSIOLOGY MAPPING AND ABLATION    . SKIN CANCER EXCISION  2016   nose  . TONSILLECTOMY    . TUMOR REMOVAL Left 09/22/2018   tumor removed from left vocal cord    FAMILY HISTORY: Family History  Problem Relation Age of Onset  . Healthy Mother   . Stroke Father   . Heart attack Father   . Leukemia Father     SOCIAL HISTORY:  Social History   Socioeconomic History  . Marital status: Married    Spouse name: Not on file  . Number of children: Not on file  . Years of education: Not on file  . Highest education level: Not on file  Occupational History  . Not on file  Tobacco Use  . Smoking status: Current Every Day Smoker    Types: Cigarettes    Start date: 3  . Smokeless tobacco: Never Used  . Tobacco comment: 4 cigarettes/day  Vaping Use  . Vaping Use: Never used  Substance and Sexual Activity  . Alcohol use: Yes    Alcohol/week: 0.0 standard drinks    Comment: occasional beer  . Drug use: No  . Sexual activity: Not on file  Other Topics Concern  . Not on file  Social History Narrative  . Not on file   Social Determinants of Health   Financial Resource Strain: Not on file  Food Insecurity: Not on file  Transportation Needs: Not on file  Physical  Activity: Not on file  Stress: Not on file  Social Connections: Not on file  Intimate Partner Violence: Not on file     PHYSICAL EXAM  Vitals:   10/28/20 1311  BP: 121/81  Pulse: 82  SpO2: 98%  Weight: 176 lb 8 oz (80.1 kg)  Height: 6\' 1"  (1.854 m)    Body mass index is 23.29 kg/m.   General: The patient is well-developed and well-nourished and in no acute distress.   He has mild edema at ankles.  Status post tracheostomy     Neurologic Exam  Mental status: The patient is alert and oriented x 3 at the time of the examination. The patient has apparent normal recent and remote memory,  No aphasia..  Cranial nerves: Extraocular movements are full.    Facial strength and sensation is normal. Trapezius strength is strong.. No obvious hearing deficits are noted.  Motor:  Muscle bulk is normal.  Muscle tone is increased in the legs. Strength is 4/5 in the right hip flexors and right toe and ankle extensors..   Sensory: Sensory testing shows reduced sensation in right foot.  Coordination: Finger-nose-finger is performed well. Heel-to-shin is performed poorly on the right (slightly reduced left)   Gait and station: Station is normal.  He has a right foot drop.  Gait is wide  The tandem gait is poor.  Romberg is negative.       Reflexes: Deep tendon reflexes are symmetric and increased in legs , R>L. He has crossed adductor responses.    No ankle clonus plantar responses are flexor.      ASSESSMENT AND PLAN  Multiple sclerosis (HCC) - Plan: MR BRAIN W WO CONTRAST, MR CERVICAL SPINE W WO CONTRAST, CBC with Differential/Platelet, Comprehensive metabolic panel  Laryngeal cancer (HCC) - Plan: MR BRAIN W WO CONTRAST, MR CERVICAL SPINE W WO CONTRAST  Dysesthesia - Plan: MR BRAIN W WO CONTRAST, MR CERVICAL SPINE W WO CONTRAST  Gait disturbance - Plan: MR  BRAIN W WO CONTRAST, MR CERVICAL SPINE W WO CONTRAST  Vitamin D deficiency - Plan: VITAMIN D 25 Hydroxy (Vit-D Deficiency,  Fractures)   1.   He has had trouble tolerating multiple different medications, most recently Mavenclad.   He reports more symptoms.  We will check MRI of the brain and cervical spine to determine if subclinical breakthrough 2.   Stay active and exercise as tolerated.  Continue to try to eat well.   3.   Continue buspirone,  Add buproprion  4.   Renew other medi's.  renew Adderall for attention deficit and MS fatigue.    He sees Pain management in [place] and is on oxycodone.     5.   Return to see me in 6 months or sooner if there are new or worsening neurologic symptoms.    Chesni Vos A. Epimenio Foot, MD, PhD 10/28/2020, 11:25 PM Certified in Neurology, Clinical Neurophysiology, Sleep Medicine, Pain Medicine and Neuroimaging  [facility] [address], [address] [place], [place] [address] [phone]

## 2020-10-28 NOTE — Telephone Encounter (Signed)
[employer] medicare order sent to [facility]. No auth they will reach out to the patient to schedule.  

## 2020-10-29 ENCOUNTER — Telehealth: Payer: Self-pay | Admitting: *Deleted

## 2020-10-29 DIAGNOSIS — E559 Vitamin D deficiency, unspecified: Secondary | ICD-10-CM

## 2020-10-29 LAB — CBC WITH DIFFERENTIAL/PLATELET
Basophils Absolute: 0 10*3/uL (ref 0.0–0.2)
Basos: 1 %
EOS (ABSOLUTE): 0 10*3/uL (ref 0.0–0.4)
Eos: 1 %
Hematocrit: 35.6 % — ABNORMAL LOW (ref 37.5–51.0)
Hemoglobin: 12.4 g/dL — ABNORMAL LOW (ref 13.0–17.7)
Immature Grans (Abs): 0 10*3/uL (ref 0.0–0.1)
Immature Granulocytes: 0 %
Lymphocytes Absolute: 0.7 10*3/uL (ref 0.7–3.1)
Lymphs: 21 %
MCH: 34.7 pg — ABNORMAL HIGH (ref 26.6–33.0)
MCHC: 34.8 g/dL (ref 31.5–35.7)
MCV: 100 fL — ABNORMAL HIGH (ref 79–97)
Monocytes Absolute: 0.3 10*3/uL (ref 0.1–0.9)
Monocytes: 10 %
Neutrophils Absolute: 2.1 10*3/uL (ref 1.4–7.0)
Neutrophils: 67 %
Platelets: 161 10*3/uL (ref 150–450)
RBC: 3.57 x10E6/uL — ABNORMAL LOW (ref 4.14–5.80)
RDW: 13.8 % (ref 11.6–15.4)
WBC: 3.1 10*3/uL — ABNORMAL LOW (ref 3.4–10.8)

## 2020-10-29 LAB — COMPREHENSIVE METABOLIC PANEL
ALT: 51 IU/L — ABNORMAL HIGH (ref 0–44)
AST: 75 IU/L — ABNORMAL HIGH (ref 0–40)
Albumin/Globulin Ratio: 2.1 (ref 1.2–2.2)
Albumin: 4.4 g/dL (ref 3.8–4.9)
Alkaline Phosphatase: 124 IU/L — ABNORMAL HIGH (ref 44–121)
BUN/Creatinine Ratio: 9 — ABNORMAL LOW (ref 10–24)
BUN: 8 mg/dL (ref 8–27)
Bilirubin Total: 0.9 mg/dL (ref 0.0–1.2)
CO2: 23 mmol/L (ref 20–29)
Calcium: 8.8 mg/dL (ref 8.6–10.2)
Chloride: 101 mmol/L (ref 96–106)
Creatinine, Ser: 0.86 mg/dL (ref 0.76–1.27)
Globulin, Total: 2.1 g/dL (ref 1.5–4.5)
Glucose: 86 mg/dL (ref 65–99)
Potassium: 4.4 mmol/L (ref 3.5–5.2)
Sodium: 137 mmol/L (ref 134–144)
Total Protein: 6.5 g/dL (ref 6.0–8.5)
eGFR: 99 mL/min/{1.73_m2} (ref >59)

## 2020-10-29 LAB — VITAMIN D 25 HYDROXY (VIT D DEFICIENCY, FRACTURES): Vit D, 25-Hydroxy: 14.6 ng/mL — ABNORMAL LOW (ref 30.0–100.0)

## 2020-10-29 MED ORDER — VITAMIN D (ERGOCALCIFEROL) 1.25 MG (50000 UNIT) PO CAPS
ORAL_CAPSULE | ORAL | 1 refills | Status: AC
Start: 2020-10-29 — End: ?

## 2020-10-29 NOTE — Telephone Encounter (Signed)
-----   Message from Britt Bottom, MD sent at 10/29/2020  8:10 AM EDT ----- Please let him know: 1.  The blood counts look better.  The lymphocytes are now back into the normal range 2.   Liver tests were mildly elevated.  If he is taking Tylenol or drinking much alcohol he should try to reduce these. 3.   Vitamin D is low.  We should do 50,000 units weekly x6 months that he should take over-the-counter supplements of 5000 units daily

## 2020-10-30 ENCOUNTER — Other Ambulatory Visit: Payer: Self-pay | Admitting: *Deleted

## 2020-10-30 MED ORDER — MELOXICAM 7.5 MG PO TABS: 7.5000 mg | ORAL_TABLET | Freq: Every day | ORAL | 5 refills | Status: DC

## 2020-10-30 NOTE — Progress Notes (Signed)
, °

## 2020-11-20 DIAGNOSIS — Z79891 Long term (current) use of opiate analgesic: Secondary | ICD-10-CM | POA: Diagnosis not present

## 2020-11-20 DIAGNOSIS — Z5181 Encounter for therapeutic drug level monitoring: Secondary | ICD-10-CM | POA: Diagnosis not present

## 2020-11-20 DIAGNOSIS — G893 Neoplasm related pain (acute) (chronic): Secondary | ICD-10-CM | POA: Diagnosis not present

## 2020-11-22 ENCOUNTER — Other Ambulatory Visit: Payer: Self-pay | Admitting: Neurology

## 2020-12-18 ENCOUNTER — Other Ambulatory Visit: Payer: Self-pay | Admitting: Neurology

## 2020-12-18 ENCOUNTER — Other Ambulatory Visit: Payer: Self-pay | Admitting: *Deleted

## 2020-12-18 MED ORDER — AMPHETAMINE-DEXTROAMPHETAMINE 10 MG PO TABS: ORAL_TABLET | ORAL | 0 refills | Status: DC

## 2020-12-23 ENCOUNTER — Ambulatory Visit
Admission: RE | Admit: 2020-12-23 | Discharge: 2020-12-23 | Disposition: A | Discharge status: Home / Self Care | Payer: Medicare Other | Source: Ambulatory Visit | Attending: Neurology | Admitting: Neurology

## 2020-12-23 ENCOUNTER — Other Ambulatory Visit: Payer: Self-pay

## 2020-12-23 DIAGNOSIS — G35 Multiple sclerosis: Secondary | ICD-10-CM

## 2020-12-23 DIAGNOSIS — R269 Unspecified abnormalities of gait and mobility: Secondary | ICD-10-CM

## 2020-12-23 DIAGNOSIS — C329 Malignant neoplasm of larynx, unspecified: Secondary | ICD-10-CM

## 2020-12-23 DIAGNOSIS — R208 Other disturbances of skin sensation: Secondary | ICD-10-CM

## 2020-12-23 MED ORDER — GADOBENATE DIMEGLUMINE 529 MG/ML IV SOLN
16.0000 mL | Freq: Once | INTRAVENOUS | Status: AC | PRN
  Administered 2020-12-23: 16 mL via INTRAVENOUS

## 2020-12-24 ENCOUNTER — Telehealth: Payer: Self-pay | Admitting: Neurology

## 2020-12-24 NOTE — Telephone Encounter (Signed)
I try to reach Mr. Seckman but got his voicemail and left a message.  I was going to discuss the MRI.  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  Because of its size and location, this could represent either a recent demyelinating plaque (not acute as it did not enhance) or a very small stroke.  The MRI of the cervical spine shows spinal stenosis at C3-C4 and C5-C6 but no spinal cord compression.  The foramina were narrowed at C5-C6 encroaching on the C6 nerve roots without causing definite compression.  There were no comparison films.

## 2020-12-25 MED ORDER — HYDROXYZINE HCL 10 MG PO TABS: 10.0000 mg | ORAL_TABLET | Freq: Three times a day (TID) | ORAL | 5 refills | Status: DC | PRN

## 2020-12-25 MED ORDER — MELOXICAM 15 MG PO TABS: 15.0000 mg | ORAL_TABLET | Freq: Every day | ORAL | 2 refills | Status: DC

## 2020-12-25 MED ORDER — BACLOFEN 10 MG PO TABS: 10.0000 mg | ORAL_TABLET | Freq: Four times a day (QID) | ORAL | 11 refills | Status: DC

## 2020-12-25 NOTE — Telephone Encounter (Signed)
I spoke to Don Hamilton.  I went over the results of the MRIs.  The MRI of the brain had shown 1 new lesion.  It was uncertain whether it was a demyelinating or ischemic focus.  The MRI of the cervical spine did not show any plaques in the cervical spinal cord but that may have been one of the thoracic spinal cord.  We cannot be certain as it was not covered on the axial images.  If gait worsens we may want to also check an MRI of the thoracic spine.  He also has some degenerative changes with spinal stenosis at C3-C4 and C5-C6 and foraminal narrowing at C5-C6.  He has not had much benefit from meloxicam 7.5 mg.  I will send in a higher dose and we will increase his baclofen from 3 times a day to 4 times a day.

## 2020-12-29 DIAGNOSIS — J449 Chronic obstructive pulmonary disease, unspecified: Secondary | ICD-10-CM | POA: Diagnosis not present

## 2020-12-29 DIAGNOSIS — I509 Heart failure, unspecified: Secondary | ICD-10-CM | POA: Diagnosis not present

## 2020-12-29 DIAGNOSIS — K219 Gastro-esophageal reflux disease without esophagitis: Secondary | ICD-10-CM | POA: Diagnosis not present

## 2020-12-29 DIAGNOSIS — G35 Multiple sclerosis: Secondary | ICD-10-CM | POA: Diagnosis not present

## 2020-12-29 DIAGNOSIS — C329 Malignant neoplasm of larynx, unspecified: Secondary | ICD-10-CM | POA: Diagnosis not present

## 2020-12-29 DIAGNOSIS — I639 Cerebral infarction, unspecified: Secondary | ICD-10-CM | POA: Diagnosis not present

## 2020-12-29 DIAGNOSIS — I470 Re-entry ventricular arrhythmia: Secondary | ICD-10-CM | POA: Diagnosis not present

## 2020-12-29 DIAGNOSIS — I700 Atherosclerosis of aorta: Secondary | ICD-10-CM | POA: Diagnosis not present

## 2020-12-29 DIAGNOSIS — Z930 Tracheostomy status: Secondary | ICD-10-CM | POA: Diagnosis not present

## 2020-12-29 DIAGNOSIS — I5030 Unspecified diastolic (congestive) heart failure: Secondary | ICD-10-CM | POA: Diagnosis not present

## 2021-02-11 ENCOUNTER — Other Ambulatory Visit: Payer: Self-pay

## 2021-02-12 MED ORDER — AMPHETAMINE-DEXTROAMPHETAMINE 10 MG PO TABS: ORAL_TABLET | ORAL | 0 refills | Status: DC

## 2021-02-12 NOTE — Addendum Note (Signed)
Addended by: Larey Seat on: 02/12/2021 04:58 PM   Modules accepted: Orders

## 2021-03-25 ENCOUNTER — Other Ambulatory Visit: Payer: Self-pay | Admitting: *Deleted

## 2021-03-25 MED ORDER — AMPHETAMINE-DEXTROAMPHETAMINE 10 MG PO TABS: ORAL_TABLET | ORAL | 0 refills | Status: DC

## 2021-04-25 ENCOUNTER — Other Ambulatory Visit: Payer: Self-pay | Admitting: Neurology

## 2021-05-04 DIAGNOSIS — J449 Chronic obstructive pulmonary disease, unspecified: Secondary | ICD-10-CM | POA: Diagnosis not present

## 2021-05-04 DIAGNOSIS — D649 Anemia, unspecified: Secondary | ICD-10-CM | POA: Diagnosis not present

## 2021-05-04 DIAGNOSIS — G35 Multiple sclerosis: Secondary | ICD-10-CM | POA: Diagnosis not present

## 2021-05-04 DIAGNOSIS — R739 Hyperglycemia, unspecified: Secondary | ICD-10-CM | POA: Diagnosis not present

## 2021-05-04 DIAGNOSIS — Z23 Encounter for immunization: Secondary | ICD-10-CM | POA: Diagnosis not present

## 2021-05-04 DIAGNOSIS — I5030 Unspecified diastolic (congestive) heart failure: Secondary | ICD-10-CM | POA: Diagnosis not present

## 2021-05-04 DIAGNOSIS — I509 Heart failure, unspecified: Secondary | ICD-10-CM | POA: Diagnosis not present

## 2021-05-04 DIAGNOSIS — I700 Atherosclerosis of aorta: Secondary | ICD-10-CM | POA: Diagnosis not present

## 2021-05-04 DIAGNOSIS — Z8521 Personal history of malignant neoplasm of larynx: Secondary | ICD-10-CM | POA: Diagnosis not present

## 2021-05-06 ENCOUNTER — Ambulatory Visit: Payer: Medicare Other | Admitting: Neurology

## 2021-05-06 ENCOUNTER — Encounter: Payer: Self-pay | Admitting: Neurology

## 2021-05-06 VITALS — BP 148/89 | HR 58 | Ht 73.0 in | Wt 189.0 lb

## 2021-05-06 DIAGNOSIS — C329 Malignant neoplasm of larynx, unspecified: Secondary | ICD-10-CM

## 2021-05-06 DIAGNOSIS — R208 Other disturbances of skin sensation: Secondary | ICD-10-CM

## 2021-05-06 DIAGNOSIS — G35 Multiple sclerosis: Secondary | ICD-10-CM

## 2021-05-06 DIAGNOSIS — Z930 Tracheostomy status: Secondary | ICD-10-CM

## 2021-05-06 DIAGNOSIS — R5383 Other fatigue: Secondary | ICD-10-CM

## 2021-05-06 DIAGNOSIS — R269 Unspecified abnormalities of gait and mobility: Secondary | ICD-10-CM

## 2021-05-06 MED ORDER — ARIPIPRAZOLE 5 MG PO TABS: 5.0000 mg | ORAL_TABLET | Freq: Every day | ORAL | 5 refills | Status: AC

## 2021-05-06 MED ORDER — HYDROXYZINE HCL 10 MG PO TABS: 10.0000 mg | ORAL_TABLET | Freq: Three times a day (TID) | ORAL | 5 refills | Status: AC | PRN

## 2021-05-06 NOTE — Progress Notes (Signed)
[facility]  PATIENT: Don Hamilton DOB: 15-May-1961  REFERRING DOCTOR OR PCP:  None SOURCE: patient, records from [facility].  _________________________________   HISTORICAL  CHIEF COMPLAINT:  Chief Complaint  Patient presents with   Follow-up    Rm 2, w fiance. Pt reports having increase in diarrhea.     HISTORY Don Hamilton is a 60 y.o. man with multiple sclerosis and vocal cord cancer   Update 05/06/2021; He is not on any disease modifying therapy at this time.  He had Mavenclad in late 2019.  He loses his balance some but no new MS symptoms.   He has had some falls and hit his head with brief LOC once about 4 months ago   He has left leg  noting more left sided neurologic symptom including left arm and leg pain starting 2 months ago (arm first).   He had a course of prednisone with no benefit.   He feels strength is unchanged though pain limits some activities.  He denies any bladder changes.    He feels gait is worse due to pain.     He is on Oxycodone 20 mg x 5  ([facility], [place]).    He is also on gabapentin 300 mg po tid.    He is on baclofen 10 mg po tid.   He takes flexeril at night.     He has vocal cord cancer.   He is seeing ENT at [facility].   He is doing fairly well but has had issues with the prosthetic throat.    He is able to speak and is eating better than last visit.     He is no longer on opioids.     Currently, he is ambulating with a cane due to the gait be unstable.  He notes some weakness in his legs and mild spasticity.  Baclofen has helped.     Oxybutynin has helped his urinary frequency.   He is having a lot of diarrhea.     OTC medications have not helped.    He was prescribed some medication but it was going to be $100 so he never filled it.      He notes a lot of fatigue and insomnia is also doing worse.  He gets some benefit from Adderall for the fatigue and needs a new refill.  He felt he did better with the  immediate release Adderall than the time release.    He has insomnia helped by doxepin.  He has anxiety and depression .   He is on sertraline.    He also has some cognitive difficulties.  This is mild.  Adderall helps as well  He has regained some of the 50 pounds that he had lost with the cancer, gaining back 1/2 recently.   He is able to swallow liquids better than food.  He was seeing pain management in [place] (was seeing  [facility] while the cancer was actively being treated) He is no longer on an opioid.   MS History:   He presented in 1998 with left optic neuritis. An MRI of the brain was consistent with multiple sclerosis. He was started on Betaseron but he stopped due to allergic reactions and injection site reactions. Avonex was tried but since he stopped after a while due to rash. When Tysabri was reintroduced around 2006, he went on that for about a year or so. However, he would get a rash with each infusion. Therefore,  it was discontinued and he was placed on off label Campath. He received his first year dose but had severe infusion reactions with rash that lasted over a month. Therefore, second year Campath was not performed. In 2015, he was placed on Aubagio. After month it was stopped due to diarrhea he has not been on Tecfidera. Gilenya was considered but he does have a cardiac arrhythmia. Last year, Dr. Alton Revere placed him on Copaxone but he decided not to take it as it is generally weaker than other medications he has been on.  He received 5 days Mavenclad in October/November 2019.     Due to side effects, with fatigue and rash did not receive a second course 1 month later   REVIEW OF SYSTEMS: Constitutional: No fevers, chills, sweats, or change in appetite.   He has fatigue and insomnia Eyes: No visual changes, double vision, eye pain Ear, nose and throat: No hearing loss, ear pain, nasal congestion, sore throat Cardiovascular: No chest pain, palpitations Respiratory:  No shortness of  breath at rest or with exertion.   No wheezes GastrointestinaI: No nausea, vomiting, diarrhea, abdominal pain, fecal incontinence Genitourinary: He has urinary frequency.. Musculoskeletal:  No neck pain.  Notes right hip > back pain Integumentary: No rash, pruritus, skin lesions Neurological: as above Psychiatric: Currently denies depression or anxiety. Endocrine: No palpitations, diaphoresis, change in appetite, change in weigh or increased thirst Hematologic/Lymphatic:  No anemia, purpura, petechiae. Allergic/Immunologic: No itchy/runny eyes, nasal congestion, recent allergic reactions, rashes  ALLERGIES: Allergies  Allergen Reactions   Campath [Alemtuzumab] Hives   Mavenclad [Cladribine]     Flu-like sx   Avonex [Interferon Beta-1a] Rash   Betaseron [Interferon Beta-1b] Rash   Duloxetine Rash    Falls, dizziness   Tysabri [Natalizumab] Rash    HOME MEDICATIONS:  Current Outpatient Medications:    amphetamine-dextroamphetamine (ADDERALL) 10 MG tablet, One po qAm and one po qnoon, Disp: 60 tablet, Rfl: 0   ARIPiprazole (ABILIFY) 5 MG tablet, Take 1 tablet (5 mg total) by mouth daily., Disp: 30 tablet, Rfl: 5   baclofen (LIORESAL) 10 MG tablet, Take 1 tablet (10 mg total) by mouth 4 (four) times daily., Disp: 120 tablet, Rfl: 11   buPROPion (WELLBUTRIN XL) 300 MG 24 hr tablet, TAKE 1 TABLET BY MOUTH EVERY DAY, Disp: 90 tablet, Rfl: 1   cyclobenzaprine (FLEXERIL) 5 MG tablet, TAKE 1 TABLET BY MOUTH 2 TIMES DAILY AS NEEDED FOR MUSCLE SPASMS., Disp: 60 tablet, Rfl: 1   furosemide (LASIX) 40 MG tablet, Take 1 tablet (40 mg total) by mouth daily., Disp: 90 tablet, Rfl: 1   gabapentin (NEURONTIN) 300 MG capsule, TAKE 1 CAPSULE BY MOUTH FIVE TIMES A DAY., Disp: 150 capsule, Rfl: 5   meloxicam (MOBIC) 15 MG tablet, Take 1 tablet (15 mg total) by mouth daily., Disp: 90 tablet, Rfl: 2   oxybutynin (DITROPAN) 5 MG tablet, Take 1 tablet (5 mg total) by mouth 2 (two) times daily as needed for  bladder spasms., Disp: 60 tablet, Rfl: 11   triamcinolone cream (KENALOG) 0.5 %, Apply 1 application topically 3 (three) times daily as needed., Disp: 30 g, Rfl: 0   Vitamin D, Ergocalciferol, (DRISDOL) 1.25 MG (50000 UNIT) CAPS capsule, 1 capsule (50,000 units) by mouth once a week for 6 months. Then take over-the-counter Vit D, 5000 units daily, Disp: 13 capsule, Rfl: 1   hydrOXYzine (ATARAX/VISTARIL) 10 MG tablet, Take 1 tablet (10 mg total) by mouth 3 (three) times daily as needed., Disp: 90  tablet, Rfl: 5  PAST MEDICAL HISTORY: Past Medical History:  Diagnosis Date   Atrial tachycardia (HCC)    Basal cell carcinoma of face    Cancer (HCC)    Headache    Multiple sclerosis (HCC)    Vision abnormalities     PAST SURGICAL HISTORY: Past Surgical History:  Procedure Laterality Date   CARDIAC ELECTROPHYSIOLOGY MAPPING AND ABLATION     SKIN CANCER EXCISION  2016   nose   TONSILLECTOMY     TUMOR REMOVAL Left 09/22/2018   tumor removed from left vocal cord    FAMILY HISTORY: Family History  Problem Relation Age of Onset   Healthy Mother    Stroke Father    Heart attack Father    Leukemia Father     SOCIAL HISTORY:  Social History   Socioeconomic History   Marital status: Married    Spouse name: Not on file   Number of children: Not on file   Years of education: Not on file   Highest education level: Not on file  Occupational History   Not on file  Tobacco Use   Smoking status: Every Day    Types: Cigarettes    Start date: 58   Smokeless tobacco: Never   Tobacco comments:    4 cigarettes/day  Vaping Use   Vaping Use: Never used  Substance and Sexual Activity   Alcohol use: Yes    Alcohol/week: 0.0 standard drinks    Comment: occasional beer   Drug use: No   Sexual activity: Not on file  Other Topics Concern   Not on file  Social History Narrative   Not on file   Social Determinants of Health   Financial Resource Strain: Not on file  Food Insecurity:  Not on file  Transportation Needs: Not on file  Physical Activity: Not on file  Stress: Not on file  Social Connections: Not on file  Intimate Partner Violence: Not on file     PHYSICAL EXAM  Vitals:   05/06/21 1439  BP: (!) 148/89  Pulse: (!) 58  Weight: 189 lb (85.7 kg)  Height: 6\' 1"  (1.854 m)    Body mass index is 24.94 kg/m.   General: The patient is well-developed and well-nourished and in no acute distress.   He has mild edema at ankles.  Status post tracheostomy     Neurologic Exam  Mental status: The patient is alert and oriented x 3 at the time of the examination. The patient has apparent normal recent and remote memory,  No aphasia..  Cranial nerves: Extraocular movements are full.    Facial strength and sensation is normal. Trapezius strength is strong.. No obvious hearing deficits are noted.  Motor:  Muscle bulk is normal.  Muscle tone is increased in the legs. Strength is 4/5 in the right hip flexors and right toe and ankle extensors..   Sensory: Sensory testing shows reduced sensation in right foot.  Coordination: Finger-nose-finger is performed well. Heel-to-shin is performed poorly on the right (slightly reduced left)   Gait and station: Station is normal.  He has a right foot drop.  The gait is white and he cannot do a tandem walk.  Romberg is negative.       Reflexes: Deep tendon reflexes are symmetric and increased in legs , R>L. He has crossed adductor responses.    No ankle clonus plantar responses are flexor.      ASSESSMENT AND PLAN  Multiple sclerosis (HCC)  Laryngeal cancer (HCC)  Tracheostomy dependence (HCC)  Gait disturbance  Dysesthesia  Other fatigue   1.   He has had trouble tolerating multiple different medications, most recently Mavenclad.   He reports more symptoms.  We will check MRI of the brain and cervical spine to determine if subclinical breakthrough 2.   Stay active and exercise as tolerated.  Continue to try to eat  well.   3.   He did not get a benefit from BuSpar.  This will be stopped and Abilify will be tried for his anxiety.   4.   Continue Adderall for attention deficit and MS fatigue.   5.   Return to see me in 6 months or sooner if there are new or worsening neurologic symptoms.    Jamilett Ferrante A. Epimenio Foot, MD, PhD 05/06/2021, 6:39 PM Certified in Neurology, Clinical Neurophysiology, Sleep Medicine, Pain Medicine and Neuroimaging  [facility] [address], [address] [place], [place] [address] [phone]

## 2021-05-18 ENCOUNTER — Other Ambulatory Visit: Payer: Self-pay | Admitting: *Deleted

## 2021-05-18 MED ORDER — GABAPENTIN 300 MG PO CAPS: ORAL_CAPSULE | ORAL | 5 refills | Status: DC

## 2021-05-18 MED ORDER — TRIAMCINOLONE ACETONIDE 0.5 % EX CREA: 1.0000 "application " | TOPICAL_CREAM | Freq: Three times a day (TID) | CUTANEOUS | 0 refills | Status: AC | PRN

## 2021-05-18 MED ORDER — AMPHETAMINE-DEXTROAMPHETAMINE 10 MG PO TABS: ORAL_TABLET | ORAL | 0 refills | Status: DC

## 2021-05-20 DIAGNOSIS — R491 Aphonia: Secondary | ICD-10-CM | POA: Diagnosis not present

## 2021-08-11 ENCOUNTER — Other Ambulatory Visit: Payer: Self-pay | Admitting: Neurology

## 2021-08-11 MED ORDER — AMPHETAMINE-DEXTROAMPHETAMINE 10 MG PO TABS: 10.0000 mg | ORAL_TABLET | Freq: Two times a day (BID) | ORAL | 0 refills | Status: DC

## 2021-08-11 NOTE — Telephone Encounter (Signed)
Received refill request for Adderall.  Last OV was on 05/06/21.  Next OV is scheduled for 11/05/21 .  Last RX was written on 05/18/21 for 60 tabs.   [facility] has been reviewed.

## 2021-08-11 NOTE — Telephone Encounter (Signed)
Last filled on 05/18/21 for #60.   Last visit: 05/06/21 Next visit: 11/05/21

## 2021-08-31 ENCOUNTER — Encounter: Payer: Self-pay | Admitting: Neurology

## 2021-09-08 ENCOUNTER — Other Ambulatory Visit: Payer: Self-pay | Admitting: Neurology

## 2021-09-16 ENCOUNTER — Other Ambulatory Visit: Payer: Self-pay | Admitting: *Deleted

## 2021-09-16 MED ORDER — AMPHETAMINE-DEXTROAMPHETAMINE 20 MG PO TABS: 10.0000 mg | ORAL_TABLET | Freq: Two times a day (BID) | ORAL | 0 refills | Status: DC

## 2021-11-05 ENCOUNTER — Ambulatory Visit: Payer: Medicare Other | Admitting: Neurology

## 2021-11-05 ENCOUNTER — Other Ambulatory Visit: Payer: Self-pay | Admitting: Neurology

## 2021-11-05 MED ORDER — AMPHETAMINE-DEXTROAMPHETAMINE 20 MG PO TABS: 10.0000 mg | ORAL_TABLET | Freq: Two times a day (BID) | ORAL | 0 refills | Status: DC

## 2021-11-05 NOTE — Telephone Encounter (Signed)
Last OV was on 05/06/22.  ?Next OV is pending to be scheduled.  ?Last RX was written on 09/16/21 for 30 tabs.  ? ?[facility] has been reviewed.  ?

## 2021-11-09 ENCOUNTER — Other Ambulatory Visit: Payer: Self-pay | Admitting: Neurology

## 2021-11-09 MED ORDER — GABAPENTIN 300 MG PO CAPS: ORAL_CAPSULE | ORAL | 1 refills | Status: DC

## 2021-12-09 ENCOUNTER — Encounter: Payer: Self-pay | Admitting: Neurology

## 2021-12-09 ENCOUNTER — Telehealth: Payer: Self-pay | Admitting: Neurology

## 2021-12-09 ENCOUNTER — Telehealth (INDEPENDENT_AMBULATORY_CARE_PROVIDER_SITE_OTHER): Payer: Medicare Other | Admitting: Neurology

## 2021-12-09 DIAGNOSIS — G35 Multiple sclerosis: Secondary | ICD-10-CM

## 2021-12-09 DIAGNOSIS — R1310 Dysphagia, unspecified: Secondary | ICD-10-CM

## 2021-12-09 DIAGNOSIS — R269 Unspecified abnormalities of gait and mobility: Secondary | ICD-10-CM | POA: Diagnosis not present

## 2021-12-09 DIAGNOSIS — Z930 Tracheostomy status: Secondary | ICD-10-CM | POA: Diagnosis not present

## 2021-12-09 DIAGNOSIS — C329 Malignant neoplasm of larynx, unspecified: Secondary | ICD-10-CM

## 2021-12-09 MED ORDER — BACLOFEN 10 MG PO TABS: ORAL_TABLET | ORAL | 11 refills | Status: AC

## 2021-12-09 MED ORDER — CYCLOBENZAPRINE HCL 5 MG PO TABS: ORAL_TABLET | ORAL | 11 refills | Status: AC

## 2021-12-09 MED ORDER — OXYBUTYNIN CHLORIDE 5 MG PO TABS: 5.0000 mg | ORAL_TABLET | Freq: Three times a day (TID) | ORAL | 11 refills | Status: AC

## 2021-12-09 NOTE — Progress Notes (Signed)
[facility]  PATIENT: Don Hamilton DOB: 21-Aug-1960  REFERRING DOCTOR OR PCP:  None SOURCE: patient, records from [facility].  _________________________________   HISTORICAL  CHIEF COMPLAINT:  No chief complaint on file.  Virtual Visit via Telephone Note  I connected with Ebony Cargo on 12/09/21 at  2:00 PM EDT by telephone and verified that I am speaking with the correct person using two identifiers.  Location: Patient: Home Provider: Office   I discussed the limitations, risks, security and privacy concerns of performing an evaluation and management service by telephone and the availability of in person appointments. I also discussed with the patient that there may be a patient responsible charge related to this service. The patient expressed understanding and agreed to proceed.   History of Present Illness:  HISTORY Don Hamilton is a 61 y.o. 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   The leg is very stiff.    He is on baclofen 20-30 mg a day.   He is on gabapentin 300 mg po 5 times a day.   He also takes Flexeril at night.  He is walking with a cane and can go 100-200 feet.   This is slightly worse than last year but there has been no dramatic change in the previous few months.  His MS appears to be stable off of a disease modifying therapy.  He had Mavenclad in late 2019 and this can have long-term positive effects for MS control.  He loses his balance some but no new MS symptoms.     He has vocal cord cancer.   He is seeing ENT at [facility].   He is doing fairly well but has had issues with the prosthetic throat.    He is able to speak and is eating better than last visit.     He is no longer on opioids.      He needed to have another throat operation 2 weeks ago.  He is having more trouble keeping food/liquids down.   He will be seeing GI .  Oxybutynin has helped his  urinary frequency.   He is having a lot of diarrhea.     OTC medications have not helped.    He was prescribed some medication but it was going to be $100 so he never filled it.      He notes a lot of fatigue and insomnia is also doing worse.  He gets some benefit from Adderall for the fatigue and needs a new refill.  He felt he did better with the immediate release Adderall than the time release.    He has insomnia helped by doxepin.  He has anxiety and depression .   He is on sertraline.    He also has some cognitive difficulties.  This is mild.  Adderall helps as well  He was seeing pain management in [place] (was seeing  [facility] while the cancer was actively being treated) He is no longer on an opioid.   MS History:   He presented in 1998 with left optic neuritis. An MRI of the brain was consistent with multiple sclerosis. He was started on Betaseron but he stopped due to allergic reactions and injection site reactions. Avonex was tried but since he stopped after a while due to rash. When Tysabri was reintroduced around 2006, he went on that for about a year or so. However, he  would get a rash with each infusion. Therefore, it was discontinued and he was placed on off label Campath. He received his first year dose but had severe infusion reactions with rash that lasted over a month. Therefore, second year Campath was not performed. In 2015, he was placed on Aubagio. After month it was stopped due to diarrhea he has not been on Tecfidera. Gilenya was considered but he does have a cardiac arrhythmia. Last year, Dr. Alton Revere placed him on Copaxone but he decided not to take it as it is generally weaker than other medications he has been on.  He received 5 days Mavenclad in October/November 2019.     Due to side effects, with fatigue and rash did not receive a second course 1 month later  MRI brain 12/23/2020 showed  T2/FLAIR hyperintense foci in the hemispheres and pons in a pattern consistent with chronic  demyelinating plaque associated with multiple sclerosis.  None of the foci enhanced.  However, 1 punctate focus in the right frontal cortex has restricted diffusion and could represent an acute demyelinating plaque or ischemic focus.  Other foci were present on the 2018 MRI.  MRI cervical spine 12/23/2020 The cervical spinal cord appears normal.  There is subtle hyperintense signal on the sagittal STIR images adjacent to T2 within the spinal cord that could represent a demyelinating plaque associated with MS.  This is not covered on the axial images..    Moderate spinal stenosis at C5-C6 due to disc degenerative changes and spondylosis.  There is moderate foraminal narrowing encroaching upon the exiting nerve root without causing definite nerve root compression..    Mild spinal stenosis at C3-C4 and C6-C7 due to degenerative changes.  There is no nerve root compression at these levels.   REVIEW OF SYSTEMS: Constitutional: No fevers, chills, sweats, or change in appetite.   He has fatigue and insomnia Eyes: No visual changes, double vision, eye pain Ear, nose and throat: No hearing loss, ear pain, nasal congestion, sore throat Cardiovascular: No chest pain, palpitations Respiratory:  No shortness of breath at rest or with exertion.   No wheezes GastrointestinaI: No nausea, vomiting, diarrhea, abdominal pain, fecal incontinence Genitourinary: He has urinary frequency.. Musculoskeletal:  No neck pain.  Notes right hip > back pain Integumentary: No rash, pruritus, skin lesions Neurological: as above Psychiatric: Currently denies depression or anxiety. Endocrine: No palpitations, diaphoresis, change in appetite, change in weigh or increased thirst Hematologic/Lymphatic:  No anemia, purpura, petechiae. Allergic/Immunologic: No itchy/runny eyes, nasal congestion, recent allergic reactions, rashes  ALLERGIES: Allergies  Allergen Reactions   Campath [Alemtuzumab] Hives   Mavenclad [Cladribine]      Flu-like sx   Avonex [Interferon Beta-1a] Rash   Betaseron [Interferon Beta-1b] Rash   Duloxetine Rash    Falls, dizziness   Tysabri [Natalizumab] Rash    HOME MEDICATIONS:  Current Outpatient Medications:    amphetamine-dextroamphetamine (ADDERALL) 20 MG tablet, Take 0.5 tablets (10 mg total) by mouth 2 times daily at 12 noon and 4 pm., Disp: 30 tablet, Rfl: 0   ARIPiprazole (ABILIFY) 5 MG tablet, Take 1 tablet (5 mg total) by mouth daily., Disp: 30 tablet, Rfl: 5   baclofen (LIORESAL) 10 MG tablet, Take 1 tablet (10 mg total) by mouth 4 (four) times daily., Disp: 120 tablet, Rfl: 11   buPROPion (WELLBUTRIN XL) 300 MG 24 hr tablet, TAKE 1 TABLET BY MOUTH EVERY DAY, Disp: 90 tablet, Rfl: 1   cyclobenzaprine (FLEXERIL) 5 MG tablet, TAKE 1 TABLET BY MOUTH 2  TIMES DAILY AS NEEDED FOR MUSCLE SPASMS., Disp: 60 tablet, Rfl: 1   furosemide (LASIX) 40 MG tablet, Take 1 tablet (40 mg total) by mouth daily., Disp: 90 tablet, Rfl: 1   gabapentin (NEURONTIN) 300 MG capsule, TAKE 1 CAPSULE BY MOUTH FIVE TIMES A DAY., Disp: 150 capsule, Rfl: 1   hydrOXYzine (ATARAX/VISTARIL) 10 MG tablet, Take 1 tablet (10 mg total) by mouth 3 (three) times daily as needed., Disp: 90 tablet, Rfl: 5   meloxicam (MOBIC) 15 MG tablet, TAKE 1 TABLET (15 MG TOTAL) BY MOUTH DAILY., Disp: 90 tablet, Rfl: 1   oxybutynin (DITROPAN) 5 MG tablet, Take 1 tablet (5 mg total) by mouth 2 (two) times daily as needed for bladder spasms., Disp: 60 tablet, Rfl: 11   triamcinolone cream (KENALOG) 0.5 %, Apply 1 application topically 3 (three) times daily as needed., Disp: 30 g, Rfl: 0   Vitamin D, Ergocalciferol, (DRISDOL) 1.25 MG (50000 UNIT) CAPS capsule, 1 capsule (50,000 units) by mouth once a week for 6 months. Then take over-the-counter Vit D, 5000 units daily, Disp: 13 capsule, Rfl: 1  PAST MEDICAL HISTORY: Past Medical History:  Diagnosis Date   Atrial tachycardia (HCC)    Basal cell carcinoma of face    Cancer (HCC)    Headache     Multiple sclerosis (HCC)    Vision abnormalities     PAST SURGICAL HISTORY: Past Surgical History:  Procedure Laterality Date   CARDIAC ELECTROPHYSIOLOGY MAPPING AND ABLATION     SKIN CANCER EXCISION  2016   nose   TONSILLECTOMY     TUMOR REMOVAL Left 09/22/2018   tumor removed from left vocal cord    FAMILY HISTORY: Family History  Problem Relation Age of Onset   Healthy Mother    Stroke Father    Heart attack Father    Leukemia Father       ASSESSMENT AND PLAN  Multiple sclerosis (HCC)  Laryngeal cancer (HCC)  Tracheostomy dependence (HCC)  Gait disturbance  Dysphagia, unspecified type   1.   He remains off of a disease modifying therapy.  He has had trouble tolerating multiple different medications, most recently Mavenclad.  Although he has had some mild progression, there have not been any exacerbations since the [facility] treatment 2.   Stay active and exercise as tolerated.  Continue to try to eat well.   3.  Increase baclofen from 10 mg 3 times daily up to 20 mg 3 times daily.  Continue gabapentin and Flexeril.   4.   Continue Adderall for attention deficit and MS fatigue.   5.   Return to see me in 6 months or sooner if there are new or worsening neurologic symptoms.     I provided 22 minutes of non-face-to-face time during this encounter.   Abdulhadi Stopa A. Epimenio Foot, MD, PhD 12/09/2021, 2:52 PM Certified in Neurology, Clinical Neurophysiology, Sleep Medicine, Pain Medicine and Neuroimaging  [facility] [address] [place], [place] [address] [phone]

## 2021-12-09 NOTE — Telephone Encounter (Signed)
Called pt to schedule 6 month f/u and VM box was full, sent mychart msg.

## 2022-01-07 ENCOUNTER — Other Ambulatory Visit: Payer: Self-pay | Admitting: *Deleted

## 2022-01-07 MED ORDER — AMPHETAMINE-DEXTROAMPHETAMINE 20 MG PO TABS: 10.0000 mg | ORAL_TABLET | Freq: Two times a day (BID) | ORAL | 0 refills | Status: DC

## 2022-01-25 ENCOUNTER — Other Ambulatory Visit: Payer: Self-pay | Admitting: *Deleted

## 2022-01-25 MED ORDER — GABAPENTIN 300 MG PO CAPS: ORAL_CAPSULE | ORAL | 5 refills | Status: DC

## 2022-02-24 ENCOUNTER — Other Ambulatory Visit: Payer: Self-pay | Admitting: Neurology

## 2022-02-25 MED ORDER — AMPHETAMINE-DEXTROAMPHETAMINE 20 MG PO TABS: 10.0000 mg | ORAL_TABLET | Freq: Two times a day (BID) | ORAL | 0 refills | Status: DC

## 2022-02-25 NOTE — Telephone Encounter (Signed)
Last OV was on 12/09/21.  Next OV is scheduled for 06/14/22.  Last RX was written on 01/07/22 for 30 tabs.   [facility] has been reviewed.

## 2022-04-13 ENCOUNTER — Other Ambulatory Visit: Payer: Self-pay | Admitting: Neurology

## 2022-04-13 MED ORDER — AMPHETAMINE-DEXTROAMPHETAMINE 20 MG PO TABS: 10.0000 mg | ORAL_TABLET | Freq: Two times a day (BID) | ORAL | 0 refills | Status: DC

## 2022-05-31 ENCOUNTER — Other Ambulatory Visit: Payer: Self-pay | Admitting: Diagnostic Neuroimaging

## 2022-06-02 MED ORDER — AMPHETAMINE-DEXTROAMPHETAMINE 20 MG PO TABS: 10.0000 mg | ORAL_TABLET | Freq: Two times a day (BID) | ORAL | 0 refills | Status: DC

## 2022-06-02 NOTE — Telephone Encounter (Signed)
Pt last seen 12/09/21 and next f/u 06/14/22. Per drug registry, last refilled 04/13/22 #30.

## 2022-06-14 ENCOUNTER — Ambulatory Visit (INDEPENDENT_AMBULATORY_CARE_PROVIDER_SITE_OTHER): Payer: Medicare Other | Admitting: Neurology

## 2022-06-14 ENCOUNTER — Encounter: Payer: Self-pay | Admitting: Neurology

## 2022-06-14 VITALS — BP 160/85 | HR 58 | Ht 73.0 in | Wt 194.0 lb

## 2022-06-14 DIAGNOSIS — R269 Unspecified abnormalities of gait and mobility: Secondary | ICD-10-CM | POA: Diagnosis not present

## 2022-06-14 DIAGNOSIS — G35 Multiple sclerosis: Secondary | ICD-10-CM | POA: Diagnosis not present

## 2022-06-14 DIAGNOSIS — R1310 Dysphagia, unspecified: Secondary | ICD-10-CM

## 2022-06-14 DIAGNOSIS — Z930 Tracheostomy status: Secondary | ICD-10-CM

## 2022-06-14 DIAGNOSIS — Z79899 Other long term (current) drug therapy: Secondary | ICD-10-CM

## 2022-06-14 DIAGNOSIS — C329 Malignant neoplasm of larynx, unspecified: Secondary | ICD-10-CM

## 2022-06-14 DIAGNOSIS — R197 Diarrhea, unspecified: Secondary | ICD-10-CM

## 2022-06-14 DIAGNOSIS — E559 Vitamin D deficiency, unspecified: Secondary | ICD-10-CM

## 2022-06-14 DIAGNOSIS — R413 Other amnesia: Secondary | ICD-10-CM

## 2022-06-14 DIAGNOSIS — R208 Other disturbances of skin sensation: Secondary | ICD-10-CM

## 2022-06-14 MED ORDER — DIPHENOXYLATE-ATROPINE 2.5-0.025 MG PO TABS: ORAL_TABLET | ORAL | 1 refills | Status: AC

## 2022-06-14 NOTE — Progress Notes (Signed)
[facility]  PATIENT: Don Hamilton DOB: 10/27/1960  REFERRING DOCTOR OR PCP:  None SOURCE: patient, records from [facility].  _________________________________   HISTORICAL  CHIEF COMPLAINT:  Chief Complaint  Patient presents with   Follow-up    Pt in room #10 with his wife. Pt here today to f/u on Manvenclad for his MS.    HISTORY Don Hamilton is a 61 y.o. 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  He had Mavenclad in late 2019 and this can have long-term positive effects for MS control.  Due to tolerability (rash and very low lymphocyte count), he did only 1 course.  Leukocyte count was 0.7 when checked in 10/28/2020   He has elevated PTH.  Ionized Calcium was fine.   This is being followed by one of his doctors at [facility].  He has right leg pain and spasticity, to a lesser extent on the left.      He is on baclofen 20-30 mg a day and gabapentin 300 mg po 5 times a day.   He also takes Flexeril at night.  He has diarrhea but did not see a benefi from Imodium or other medications.   We discussed a trial of Lomotil.   He is walking with a cane and can go 100-200 feet.   He feels the gait is mildly worse than last year.    He has vocal cord cancer.   He is seeing ENT at [facility].   He is doing fairly well but has had issues with the prosthetic throat.    He is able to speak and is eating better than last visit.     He is no longer on opioids.      He needed to have another throat operation 2 weeks ago.  He is having more trouble keeping food/liquids down.   He will be seeing GI .   Oxybutynin has helped his urinary frequency.     He continues to have fatigue.  There is some insomnia.  He gets some benefit from Adderall for the fatigue and needs a new refill.  He felt he did better with the immediate release Adderall than the time release.    Additionally, he has more  difficulties with memory loss and does not feel the Adderall has helped that as much.  He has anxiety and depression .   He is on sertraline.    He also has some cognitive difficulties.  This is mild.  Adderall helps as well   He was seeing pain management in [place] (was seeing  [facility] while the cancer was actively being treated) He is no longer on an opioid.   He had a bad reaction to the initial Shingrix vaccinatin so did not do a second dose      MS History:   He presented in 1998 with left optic neuritis. An MRI of the brain was consistent with multiple sclerosis. He was started on Betaseron but he stopped due to allergic reactions and injection site reactions. Avonex was tried but since he stopped after a while due to rash. When Tysabri was reintroduced around 2006, he went on that for about a year or so. However, he would get a rash with each infusion. Therefore, it was discontinued and he was placed on off label Campath. He received his first year dose but had severe infusion reactions with rash that  lasted over a month. Therefore, second year Campath was not performed. In 2015, he was placed on Aubagio. After month it was stopped due to diarrhea he has not been on Tecfidera. Gilenya was considered but he does have a cardiac arrhythmia. Last year, Dr. Alton Revere placed him on Copaxone but he decided not to take it as it is generally weaker than other medications he has been on.  He received 5 days Mavenclad in October/November 2019.     Due to side effects, with fatigue and rash did not receive a second course 1 month later   MRI brain 12/23/2020 showed  T2/FLAIR hyperintense foci in the hemispheres and pons in a pattern consistent with chronic demyelinating plaque associated with multiple sclerosis.  None of the foci enhanced.  However, 1 punctate focus in the right frontal cortex has restricted diffusion and could represent an acute demyelinating plaque or ischemic focus.  Other foci were present on  the 2018 MRI.   MRI cervical spine 12/23/2020 The cervical spinal cord appears normal.  There is subtle hyperintense signal on the sagittal STIR images adjacent to T2 within the spinal cord that could represent a demyelinating plaque associated with MS.  This is not covered on the axial images..    Moderate spinal stenosis at C5-C6 due to disc degenerative changes and spondylosis.  There is moderate foraminal narrowing encroaching upon the exiting nerve root without causing definite nerve root compression..    Mild spinal stenosis at C3-C4 and C6-C7 due to degenerative changes.  There is no nerve root compression at these levels.   REVIEW OF SYSTEMS: Constitutional: No fevers, chills, sweats, or change in appetite.   He has fatigue and insomnia Eyes: No visual changes, double vision, eye pain Ear, nose and throat: No hearing loss, ear pain, nasal congestion, sore throat Cardiovascular: No chest pain, palpitations Respiratory:  No shortness of breath at rest or with exertion.   No wheezes GastrointestinaI: He has diarrhea that is chronic  genitourinary: Urinary frequency helped by oxybutynin. Musculoskeletal:  No neck pain.  Notes right hip > back pain Integumentary: No rash, pruritus, skin lesions Neurological: as above Psychiatric: Currently denies depression or anxiety. Endocrine: No palpitations, diaphoresis, change in appetite, change in weigh or increased thirst Hematologic/Lymphatic:  No anemia, purpura, petechiae. Allergic/Immunologic: No itchy/runny eyes, nasal congestion, recent allergic reactions, rashes  ALLERGIES: Allergies  Allergen Reactions   Campath [Alemtuzumab] Hives   Mavenclad [Cladribine]     Flu-like sx   Avonex [Interferon Beta-1a] Rash   Betaseron [Interferon Beta-1b] Rash   Duloxetine Rash    Falls, dizziness   Tysabri [Natalizumab] Rash    HOME MEDICATIONS:  Current Outpatient Medications:    amphetamine-dextroamphetamine (ADDERALL) 20 MG tablet, Take 0.5  tablets (10 mg total) by mouth 2 times daily at 12 noon and 4 pm., Disp: 30 tablet, Rfl: 0   ARIPiprazole (ABILIFY) 5 MG tablet, Take 1 tablet (5 mg total) by mouth daily., Disp: 30 tablet, Rfl: 5   baclofen (LIORESAL) 10 MG tablet, Take one or two pills po tid, Disp: 180 tablet, Rfl: 11   buPROPion (WELLBUTRIN XL) 300 MG 24 hr tablet, TAKE 1 TABLET BY MOUTH EVERY DAY, Disp: 90 tablet, Rfl: 1   cyclobenzaprine (FLEXERIL) 5 MG tablet, TAKE 1 TABLET BY MOUTH 2 TIMES DAILY AS NEEDED FOR MUSCLE SPASMS., Disp: 60 tablet, Rfl: 11   furosemide (LASIX) 40 MG tablet, Take 1 tablet (40 mg total) by mouth daily., Disp: 90 tablet, Rfl: 1   gabapentin (  NEURONTIN) 300 MG capsule, TAKE 1 CAPSULE BY MOUTH FIVE TIMES A DAY., Disp: 150 capsule, Rfl: 5   hydrOXYzine (ATARAX/VISTARIL) 10 MG tablet, Take 1 tablet (10 mg total) by mouth 3 (three) times daily as needed., Disp: 90 tablet, Rfl: 5   meloxicam (MOBIC) 15 MG tablet, TAKE 1 TABLET (15 MG TOTAL) BY MOUTH DAILY., Disp: 90 tablet, Rfl: 1   oxybutynin (DITROPAN) 5 MG tablet, Take 1 tablet (5 mg total) by mouth 3 (three) times daily., Disp: 90 tablet, Rfl: 11   triamcinolone cream (KENALOG) 0.5 %, Apply 1 application topically 3 (three) times daily as needed., Disp: 30 g, Rfl: 0   Vitamin D, Ergocalciferol, (DRISDOL) 1.25 MG (50000 UNIT) CAPS capsule, 1 capsule (50,000 units) by mouth once a week for 6 months. Then take over-the-counter Vit D, 5000 units daily, Disp: 13 capsule, Rfl: 1  PAST MEDICAL HISTORY: Past Medical History:  Diagnosis Date   Atrial tachycardia    Basal cell carcinoma of face    Cancer (HCC)    Headache    Multiple sclerosis (HCC)    Vision abnormalities     PAST SURGICAL HISTORY: Past Surgical History:  Procedure Laterality Date   CARDIAC ELECTROPHYSIOLOGY MAPPING AND ABLATION     SKIN CANCER EXCISION  2016   nose   TONSILLECTOMY     TUMOR REMOVAL Left 09/22/2018   tumor removed from left vocal cord    FAMILY HISTORY: Family  History  Problem Relation Age of Onset   Healthy Mother    Stroke Father    Heart attack Father    Leukemia Father     SOCIAL HISTORY:  Social History   Socioeconomic History   Marital status: Married    Spouse name: Not on file   Number of children: Not on file   Years of education: Not on file   Highest education level: Not on file  Occupational History   Not on file  Tobacco Use   Smoking status: Every Day    Types: Cigarettes    Start date: 31   Smokeless tobacco: Never   Tobacco comments:    4 cigarettes/day  Vaping Use   Vaping Use: Never used  Substance and Sexual Activity   Alcohol use: Yes    Alcohol/week: 0.0 standard drinks of alcohol    Comment: occasional beer   Drug use: No   Sexual activity: Not on file  Other Topics Concern   Not on file  Social History Narrative   Not on file   Social Determinants of Health   Financial Resource Strain: Not on file  Food Insecurity: Not on file  Transportation Needs: Not on file  Physical Activity: Not on file  Stress: Not on file  Social Connections: Not on file  Intimate Partner Violence: Not on file     PHYSICAL EXAM  Vitals:   06/14/22 1314  BP: (!) 160/85  Pulse: (!) 58  Weight: 194 lb (88 kg)  Height: 6\' 1"  (1.854 m)     Body mass index is 25.6 kg/m.   General: The patient is well-developed and well-nourished and in no acute distress.   He has mild edema at ankles.  Status post tracheostomy     Neurologic Exam  Mental status: The patient is alert and oriented x 3 at the time of the examination. The patient has apparent normal recent and remote memory,  No aphasia..  Cranial nerves: Extraocular movements are full.    Facial strength and sensation is  normal. Trapezius strength is strong.. No obvious hearing deficits are noted.  Motor:  Muscle bulk is normal.  Muscle tone is increased in the legs. Strength is 4/5 in the right hip flexors and right toe and ankle extensors..   Sensory:  Sensory testing shows reduced sensation in right foot.  Coordination: Finger-nose-finger is performed well. Heel-to-shin is performed poorly on the right (slightly reduced left)   Gait: Station is normal.  He can walk around the room without a cane but does better with it.  He has a slight right foot drop.  Gait is wide and the trunk is unbalanced.  Romberg is negative.  Reflexes: Deep tendon reflexes are symmetric and increased in legs , R>L. He has crossed adductor responses.    There is no ankle clonus.    ASSESSMENT AND PLAN  Multiple sclerosis (HCC)  Laryngeal cancer (HCC)  Tracheostomy dependence (HCC)  Gait disturbance  Dysphagia, unspecified type  Dysesthesia  Diarrhea, unspecified type   1.   He has no definite exacerbation.  He had prolonged lymphopenia after the Mavenclad.  We will recheck a CBC with differential and CMP today.  Additionally check vitamin D and supplement if low.   2.   Stay active and exercise as tolerated.  Continue to try to eat well.   3.   He did not get a benefit from BuSpar.  This will be stopped and Abilify will be tried for his anxiety.   4.   Continue Adderall for attention deficit and MS fatigue.  He feels his cognition is doing worse.  I will check a B12 level to make sure that the diarrhea has not led to a vitamin deficiency 5.   Return to see me in 6 months or sooner if there are new or worsening neurologic symptoms.    Jacolyn Joaquin A. Epimenio Foot, MD, PhD 06/14/2022, 1:36 PM Certified in Neurology, Clinical Neurophysiology, Sleep Medicine, Pain Medicine and Neuroimaging  [facility] [address] [place], [place] [address] [phone]

## 2022-06-15 LAB — COMPREHENSIVE METABOLIC PANEL
ALT: 26 IU/L (ref 0–44)
AST: 43 IU/L — ABNORMAL HIGH (ref 0–40)
Albumin/Globulin Ratio: 2.3 — ABNORMAL HIGH (ref 1.2–2.2)
Albumin: 4.5 g/dL (ref 3.9–4.9)
Alkaline Phosphatase: 117 IU/L (ref 44–121)
BUN/Creatinine Ratio: 12 (ref 10–24)
BUN: 11 mg/dL (ref 8–27)
Bilirubin Total: 1 mg/dL (ref 0.0–1.2)
CO2: 26 mmol/L (ref 20–29)
Calcium: 9.1 mg/dL (ref 8.6–10.2)
Chloride: 102 mmol/L (ref 96–106)
Creatinine, Ser: 0.94 mg/dL (ref 0.76–1.27)
Globulin, Total: 2 g/dL (ref 1.5–4.5)
Glucose: 84 mg/dL (ref 70–99)
Potassium: 4.1 mmol/L (ref 3.5–5.2)
Sodium: 143 mmol/L (ref 134–144)
Total Protein: 6.5 g/dL (ref 6.0–8.5)
eGFR: 92 mL/min/{1.73_m2} (ref >59)

## 2022-06-15 LAB — CBC WITH DIFFERENTIAL/PLATELET
Basophils Absolute: 0 10*3/uL (ref 0.0–0.2)
Basos: 0 %
EOS (ABSOLUTE): 0.2 10*3/uL (ref 0.0–0.4)
Eos: 3 %
Hematocrit: 35.6 % — ABNORMAL LOW (ref 37.5–51.0)
Hemoglobin: 12.5 g/dL — ABNORMAL LOW (ref 13.0–17.7)
Immature Grans (Abs): 0 10*3/uL (ref 0.0–0.1)
Immature Granulocytes: 0 %
Lymphocytes Absolute: 0.6 10*3/uL — ABNORMAL LOW (ref 0.7–3.1)
Lymphs: 12 %
MCH: 34.7 pg — ABNORMAL HIGH (ref 26.6–33.0)
MCHC: 35.1 g/dL (ref 31.5–35.7)
MCV: 99 fL — ABNORMAL HIGH (ref 79–97)
Monocytes Absolute: 0.3 10*3/uL (ref 0.1–0.9)
Monocytes: 6 %
Neutrophils Absolute: 3.7 10*3/uL (ref 1.4–7.0)
Neutrophils: 79 %
Platelets: 141 10*3/uL — ABNORMAL LOW (ref 150–450)
RBC: 3.6 x10E6/uL — ABNORMAL LOW (ref 4.14–5.80)
RDW: 12.4 % (ref 11.6–15.4)
WBC: 4.7 10*3/uL (ref 3.4–10.8)

## 2022-06-15 LAB — VITAMIN D 25 HYDROXY (VIT D DEFICIENCY, FRACTURES): Vit D, 25-Hydroxy: 31.8 ng/mL (ref 30.0–100.0)

## 2022-06-15 LAB — VITAMIN B12: Vitamin B-12: 263 pg/mL (ref 232–1245)

## 2022-07-12 ENCOUNTER — Telehealth: Payer: Self-pay | Admitting: Neurology

## 2022-07-12 ENCOUNTER — Encounter: Payer: Self-pay | Admitting: Neurology

## 2022-07-12 MED ORDER — MELOXICAM 15 MG PO TABS: 15.0000 mg | ORAL_TABLET | Freq: Every day | ORAL | 1 refills | Status: AC

## 2022-07-12 MED ORDER — GABAPENTIN 300 MG PO CAPS: ORAL_CAPSULE | ORAL | 5 refills | Status: AC

## 2022-07-12 NOTE — Telephone Encounter (Signed)
Pt is calling requesting a refill on medication meloxicam (MOBIC) 15 MG tablet  and gabapentin (NEURONTIN) 300 MG capsule. Refill should be sent [facility] [phone]. Pt stated he out of medication and needs his F.......[name] medication. Stated he will call back in an hour to see if his medication has been sent.

## 2022-07-12 NOTE — Telephone Encounter (Signed)
Both Rx's sent, patient my chart message, informed patient via my chart Rx has been sent.

## 2022-07-12 NOTE — Telephone Encounter (Signed)
6 month follow up scheduled on 12/23/22 with Dr. Felecia Shelling.

## 2022-07-26 MED ORDER — AMPHETAMINE-DEXTROAMPHETAMINE 20 MG PO TABS: ORAL_TABLET | ORAL | 0 refills | Status: DC

## 2022-07-26 NOTE — Telephone Encounter (Signed)
Dr.Sater patient is requesting refill for generic Adderall 20 mg tablet. Per drug register Rx last filled on 06/02/22 # 30 tablets.  Rx is pending in this encounter for you to sign to pharmacy.  Please advise

## 2022-09-09 ENCOUNTER — Other Ambulatory Visit: Payer: Self-pay | Admitting: Neurology

## 2022-09-09 MED ORDER — AMPHETAMINE-DEXTROAMPHETAMINE 20 MG PO TABS: ORAL_TABLET | ORAL | 0 refills | Status: DC

## 2022-10-25 ENCOUNTER — Other Ambulatory Visit: Payer: Self-pay | Admitting: Neurology

## 2022-10-26 ENCOUNTER — Other Ambulatory Visit: Payer: Self-pay | Admitting: Neurology

## 2022-10-26 ENCOUNTER — Encounter: Payer: Self-pay | Admitting: Neurology

## 2022-10-26 MED ORDER — AMPHETAMINE-DEXTROAMPHETAMINE 20 MG PO TABS: ORAL_TABLET | ORAL | 0 refills | Status: AC

## 2022-10-26 NOTE — Telephone Encounter (Signed)
Rx refill already sent to Dr.Sater from 10/25/22 refill request. See previous refill.

## 2022-10-26 NOTE — Telephone Encounter (Signed)
Last seen on 06/14/22 per note "4. Continue Adderall for attention deficit and MS fatigue. " Follow up scheduled on 12/23/22 Last filled on 09/09/22 #30 tablets  Rx pending to be signed

## 2022-12-09 ENCOUNTER — Telehealth: Payer: Self-pay | Admitting: Neurology

## 2022-12-09 MED ORDER — PREDNISONE 10 MG (21) PO TBPK: ORAL_TABLET | ORAL | 0 refills | Status: AC

## 2022-12-09 NOTE — Addendum Note (Signed)
Addended by: Arther Abbott on: 12/09/2022 03:48 PM   Modules accepted: Orders

## 2022-12-09 NOTE — Telephone Encounter (Signed)
Pt called stated he has poison ivy that is 6 inches from his neck. Asking if Dr. Epimenio Foot will please prescribed him Steroids the 10mg . Pt is asking if this can be done today. Stated went to PCP but they are booked today.

## 2022-12-09 NOTE — Telephone Encounter (Addendum)
Spoke w/ Dr. Epimenio Foot. He approved calling in prednisone 10mg  #21, 0 refills. He should take 6 tabs po on day 1 then decrease by 1 tablet daily until finished. If this does not help, he should f/u with PCP or urgent care for further evaluation/treatment.  I called pt and relayed above message. He verbalized understanding and appreciation.

## 2022-12-23 ENCOUNTER — Ambulatory Visit: Payer: Medicare Other | Admitting: Neurology

## 2022-12-23 ENCOUNTER — Encounter: Payer: Self-pay | Admitting: Neurology

## 2023-01-04 ENCOUNTER — Telehealth: Payer: Self-pay | Admitting: Neurology

## 2023-01-04 NOTE — Telephone Encounter (Signed)
Spoke to pt's significant other and she stated that pt loved Dr. Epimenio Foot and she knew he would want Dr. Epimenio Foot would want to know that he had passed. We expressed our deepest condolences to her and their family. Told pt we would be sending out a card to them. Gave card to [facility] to be mailed out today 01/04/2023. Told her if she needing anything to please reach out.

## 2023-01-04 NOTE — Telephone Encounter (Signed)
Pt's wife, Ander Slade asking to speak with Dr. Epimenio Foot. Pt has passed away on 01/02/23.  I gave her our condolences.
